# Patient Record
Sex: Male | Born: 1965 | Race: Black or African American | Hispanic: No | Marital: Married | Smoking: Never smoker
Health system: Southern US, Community
[De-identification: ages and names within clinical notes are randomized; demographics above are authoritative.]

## PROBLEM LIST (undated history)

## (undated) DIAGNOSIS — I10 Essential (primary) hypertension: Secondary | ICD-10-CM

## (undated) DIAGNOSIS — R519 Headache, unspecified: Secondary | ICD-10-CM

## (undated) DIAGNOSIS — R51 Headache: Secondary | ICD-10-CM

## (undated) DIAGNOSIS — M549 Dorsalgia, unspecified: Secondary | ICD-10-CM

## (undated) HISTORY — PX: ROTATOR CUFF REPAIR: SHX139

## (undated) HISTORY — DX: Essential (primary) hypertension: I10

## (undated) HISTORY — DX: Headache, unspecified: R51.9

## (undated) HISTORY — DX: Dorsalgia, unspecified: M54.9

## (undated) HISTORY — DX: Headache: R51

---

## 2011-10-26 HISTORY — PX: COLONOSCOPY: SHX174

## 2011-10-27 LAB — HM COLONOSCOPY

## 2014-06-18 LAB — PSA: PSA: 0.9

## 2014-06-18 LAB — CBC AND DIFFERENTIAL: HEMOGLOBIN: 15 g/dL (ref 13.5–17.5)

## 2014-06-18 LAB — TSH: TSH: 1.2 u[IU]/mL (ref <=5.90)

## 2014-06-18 LAB — LIPID PANEL
Cholesterol: 211 mg/dL — AB (ref 0–200)
HDL: 75 mg/dL — AB (ref 35–70)
LDL Cholesterol: 117 mg/dL
TRIGLYCERIDES: 95 mg/dL (ref 40–160)

## 2015-08-19 ENCOUNTER — Other Ambulatory Visit: Payer: Self-pay

## 2015-08-24 ENCOUNTER — Encounter: Payer: Self-pay | Admitting: Internal Medicine

## 2015-08-24 ENCOUNTER — Other Ambulatory Visit: Payer: Self-pay | Admitting: Internal Medicine

## 2015-08-24 DIAGNOSIS — G2581 Restless legs syndrome: Secondary | ICD-10-CM | POA: Insufficient documentation

## 2015-08-24 DIAGNOSIS — I10 Essential (primary) hypertension: Secondary | ICD-10-CM | POA: Insufficient documentation

## 2015-08-24 DIAGNOSIS — K580 Irritable bowel syndrome with diarrhea: Secondary | ICD-10-CM | POA: Insufficient documentation

## 2015-08-24 DIAGNOSIS — G43009 Migraine without aura, not intractable, without status migrainosus: Secondary | ICD-10-CM | POA: Insufficient documentation

## 2015-08-29 ENCOUNTER — Ambulatory Visit (INDEPENDENT_AMBULATORY_CARE_PROVIDER_SITE_OTHER): Payer: BLUE CROSS/BLUE SHIELD | Admitting: Internal Medicine

## 2015-08-29 ENCOUNTER — Encounter: Payer: Self-pay | Admitting: Internal Medicine

## 2015-08-29 VITALS — BP 140/84 | HR 84 | Ht 62.0 in | Wt 209.8 lb

## 2015-08-29 DIAGNOSIS — K580 Irritable bowel syndrome with diarrhea: Secondary | ICD-10-CM | POA: Diagnosis not present

## 2015-08-29 DIAGNOSIS — G43009 Migraine without aura, not intractable, without status migrainosus: Secondary | ICD-10-CM | POA: Diagnosis not present

## 2015-08-29 DIAGNOSIS — I10 Essential (primary) hypertension: Secondary | ICD-10-CM

## 2015-08-29 MED ORDER — AMLODIPINE BESYLATE 10 MG PO TABS: 10.0000 mg | ORAL_TABLET | Freq: Every day | ORAL | Status: DC

## 2015-08-29 MED ORDER — BENAZEPRIL HCL 20 MG PO TABS: 20.0000 mg | ORAL_TABLET | Freq: Every day | ORAL | Status: DC

## 2015-08-29 NOTE — Progress Notes (Signed)
Date:  08/29/2015   Name:  Earl Garcia   DOB:  1966-01-18   MRN:  [id number]   Chief Complaint: Hypertension Hypertension This is a chronic problem. The current episode started more than 1 year ago. The problem is unchanged. The problem is controlled. There are no associated agents to hypertension. Risk factors for coronary artery disease include diabetes mellitus and dyslipidemia. Past treatments include calcium channel blockers. The current treatment provides significant improvement. There are no compliance problems.   Migraine  This is a chronic problem. The current episode started more than 1 year ago. The problem occurs intermittently. Progression since onset: no headaches in the past 6 months. The pain quality is similar to prior headaches. The pain is severe. His past medical history is significant for hypertension.    Review of Systems  Patient Active Problem List   Diagnosis Date Noted  . Essential (primary) hypertension 08/24/2015  . Irritable bowel syndrome with diarrhea 08/24/2015  . Migraine without aura and responsive to treatment 08/24/2015  . Restless leg 08/24/2015    Prior to Admission medications   Medication Sig Start Date End Date Taking? Authorizing Provider  amLODipine (NORVASC) 10 MG tablet Take 1 tablet by mouth daily. 06/18/14  Yes Historical Provider, MD  benazepril (LOTENSIN) 20 MG tablet Take 1 tablet by mouth daily. 06/18/14  Yes Historical Provider, MD  SUMAtriptan (IMITREX) 100 MG tablet Take 1 tablet by mouth daily as needed. 02/04/15  Yes Historical Provider, MD  hyoscyamine (LEVSIN SL) 0.125 MG SL tablet Place 1 tablet under the tongue every 4 (four) hours as needed. 12/27/12   Historical Provider, MD    No Known Allergies  Past Surgical History  Procedure Laterality Date  . Colonoscopy  2013    tubular adenoma  . Rotator cuff repair      Social History  Substance Use Topics  . Smoking status: Never Smoker   . Smokeless tobacco: None  . Alcohol  Use: 2.4 oz/week    4 Standard drinks or equivalent per week     Medication list has been reviewed and updated.   Physical Exam  Constitutional: He is oriented to person, place, and time. He appears well-developed and well-nourished. No distress.  HENT:  Head: Normocephalic and atraumatic.  Eyes: Conjunctivae are normal. Right eye exhibits no discharge. Left eye exhibits no discharge. No scleral icterus.  Neck: Normal range of motion. Neck supple. No thyromegaly present.  Cardiovascular: Normal rate, regular rhythm and normal heart sounds.   No murmur heard. Pulmonary/Chest: Effort normal and breath sounds normal. No respiratory distress. He has no wheezes.  Musculoskeletal: Normal range of motion. He exhibits no edema.  Neurological: He is alert and oriented to person, place, and time. He has normal reflexes.  Skin: Skin is warm and dry. No rash noted.  Psychiatric: He has a normal mood and affect. His behavior is normal. Thought content normal.  Nursing note and vitals reviewed.   BP 140/84 mmHg  Pulse 84  Ht  (1.575 m)  Wt 209 lb 12.8 oz (95.165 kg)  BMI 38.36 kg/m2  Assessment and Plan: 1. Essential (primary) hypertension Controlled on current regimen - CBC with Differential/Platelet - Comprehensive metabolic panel - TSH - amLODipine (NORVASC) 10 MG tablet; Take 1 tablet (10 mg total) by mouth daily.  Dispense: 90 tablet; Refill: 3 - benazepril (LOTENSIN) 20 MG tablet; Take 1 tablet (20 mg total) by mouth daily.  Dispense: 90 tablet; Refill: 3  2. Migraine without aura and responsive  to treatment Well with only intermittent migraines and responsive to Imitrex  3. Irritable bowel syndrome with diarrhea Minimally symptomatic - requiring treatment at this time Continue high fiber diet   Bari EdwardLaura Charlene Cowdrey, MD [facility]  08/29/2015

## 2015-08-30 LAB — COMPREHENSIVE METABOLIC PANEL
ALK PHOS: 98 IU/L (ref 39–117)
ALT: 28 IU/L (ref 0–44)
AST: 23 IU/L (ref 0–40)
Albumin/Globulin Ratio: 1.5 (ref 1.1–2.5)
Albumin: 4.6 g/dL (ref 3.5–5.5)
BILIRUBIN TOTAL: 0.3 mg/dL (ref 0.0–1.2)
BUN / CREAT RATIO: 14 (ref 9–20)
BUN: 13 mg/dL (ref 6–24)
CO2: 25 mmol/L (ref 18–29)
CREATININE: 0.92 mg/dL (ref 0.76–1.27)
Calcium: 10.1 mg/dL (ref 8.7–10.2)
Chloride: 99 mmol/L (ref 97–106)
GFR calc Af Amer: 112 mL/min/{1.73_m2} (ref >59)
GFR calc non Af Amer: 97 mL/min/{1.73_m2} (ref >59)
GLUCOSE: 121 mg/dL — AB (ref 65–99)
Globulin, Total: 3 g/dL (ref 1.5–4.5)
Potassium: 3.8 mmol/L (ref 3.5–5.2)
Sodium: 142 mmol/L (ref 136–144)
Total Protein: 7.6 g/dL (ref 6.0–8.5)

## 2015-08-30 LAB — CBC WITH DIFFERENTIAL/PLATELET
BASOS ABS: 0 10*3/uL (ref 0.0–0.2)
BASOS: 0 %
EOS (ABSOLUTE): 0.1 10*3/uL (ref 0.0–0.4)
EOS: 2 %
HEMOGLOBIN: 15.3 g/dL (ref 12.6–17.7)
Hematocrit: 43.8 % (ref 37.5–51.0)
Immature Grans (Abs): 0 10*3/uL (ref 0.0–0.1)
Immature Granulocytes: 0 %
LYMPHS: 37 %
Lymphocytes Absolute: 1.7 10*3/uL (ref 0.7–3.1)
MCH: 29.2 pg (ref 26.6–33.0)
MCHC: 34.9 g/dL (ref 31.5–35.7)
MCV: 84 fL (ref 79–97)
Monocytes Absolute: 0.5 10*3/uL (ref 0.1–0.9)
Monocytes: 11 %
NEUTROS ABS: 2.3 10*3/uL (ref 1.4–7.0)
NEUTROS PCT: 50 %
PLATELETS: 287 10*3/uL (ref 150–379)
RBC: 5.24 x10E6/uL (ref 4.14–5.80)
RDW: 14.3 % (ref 12.3–15.4)
WBC: 4.6 10*3/uL (ref 3.4–10.8)

## 2015-08-30 LAB — TSH: TSH: 0.711 u[IU]/mL (ref 0.450–4.500)

## 2015-09-02 LAB — HGB A1C W/O EAG: Hgb A1c MFr Bld: 5.6 % (ref 4.8–5.6)

## 2015-09-02 LAB — SPECIMEN STATUS REPORT

## 2016-07-23 ENCOUNTER — Other Ambulatory Visit: Payer: Self-pay | Admitting: Internal Medicine

## 2016-07-26 ENCOUNTER — Other Ambulatory Visit: Payer: Self-pay | Admitting: Internal Medicine

## 2016-07-27 ENCOUNTER — Ambulatory Visit (INDEPENDENT_AMBULATORY_CARE_PROVIDER_SITE_OTHER): Payer: BLUE CROSS/BLUE SHIELD | Admitting: Internal Medicine

## 2016-07-27 ENCOUNTER — Encounter: Payer: Self-pay | Admitting: Internal Medicine

## 2016-07-27 VITALS — BP 118/80 | HR 80 | Resp 16 | Ht 62.0 in | Wt 209.0 lb

## 2016-07-27 DIAGNOSIS — I10 Essential (primary) hypertension: Secondary | ICD-10-CM

## 2016-07-27 DIAGNOSIS — G43009 Migraine without aura, not intractable, without status migrainosus: Secondary | ICD-10-CM | POA: Diagnosis not present

## 2016-07-27 MED ORDER — TRAMADOL HCL 50 MG PO TABS: 50.0000 mg | ORAL_TABLET | Freq: Every day | ORAL | 0 refills | Status: DC

## 2016-07-27 MED ORDER — SUMATRIPTAN SUCCINATE 100 MG PO TABS: 100.0000 mg | ORAL_TABLET | Freq: Two times a day (BID) | ORAL | 5 refills | Status: DC | PRN

## 2016-07-27 MED ORDER — BENAZEPRIL HCL 20 MG PO TABS: 20.0000 mg | ORAL_TABLET | Freq: Every day | ORAL | 3 refills | Status: DC

## 2016-07-27 MED ORDER — AMLODIPINE BESYLATE 10 MG PO TABS: 10.0000 mg | ORAL_TABLET | Freq: Every day | ORAL | 3 refills | Status: DC

## 2016-07-27 MED ORDER — BUTALBITAL-APAP-CAFFEINE 50-325-40 MG PO TABS
1.0000 | ORAL_TABLET | Freq: Four times a day (QID) | ORAL | 2 refills | Status: AC | PRN
Start: 2016-07-27 — End: 2017-07-27

## 2016-07-27 NOTE — Progress Notes (Signed)
Date:  07/27/2016   Name:  Earl Garcia   DOB:  Dec 12, 1965   MRN:  [id number]   Chief Complaint: Migraine (1 week ) Migraine   This is a recurrent problem. The current episode started more than 1 year ago. The problem occurs intermittently. The problem has been unchanged. Associated symptoms include nausea. Pertinent negatives include no coughing, dizziness, fever, vomiting or weakness. His past medical history is significant for hypertension.  Hypertension  This is a chronic problem. The current episode started more than 1 year ago. The problem is unchanged. The problem is controlled. Associated symptoms include headaches. Pertinent negatives include no chest pain, palpitations or shortness of breath.   He rarely has a migraine headache but has one now that has lasted 6 days.  He has taken butalbital alternating with imitrex with only temporary improvement in headache.    He reports having glucose and cholesterol done at work at [employer] last week.  He also got a flu shot.  He has not had CMP in several years.   Review of Systems  Constitutional: Negative for chills, fatigue and fever.  Eyes: Negative for visual disturbance.  Respiratory: Negative for cough, chest tightness and shortness of breath.   Cardiovascular: Negative for chest pain, palpitations and leg swelling.  Gastrointestinal: Positive for nausea. Negative for vomiting.  Neurological: Positive for light-headedness and headaches. Negative for dizziness, tremors and weakness.  Psychiatric/Behavioral: Positive for sleep disturbance (intermittent). Negative for dysphoric mood.    Patient Active Problem List   Diagnosis Date Noted  . Essential (primary) hypertension 08/24/2015  . Irritable bowel syndrome with diarrhea 08/24/2015  . Migraine without aura and responsive to treatment 08/24/2015  . Restless leg 08/24/2015    Prior to Admission medications   Medication Sig Start Date End Date Taking? Authorizing Provider    amLODipine (NORVASC) 10 MG tablet Take 1 tablet (10 mg total) by mouth daily. 08/29/15  Yes Reubin Milan, MD  benazepril (LOTENSIN) 20 MG tablet Take 1 tablet (20 mg total) by mouth daily. 08/29/15  Yes Reubin Milan, MD  SUMAtriptan (IMITREX) 100 MG tablet TAKE ONE TABLET BY MOUTH TWICE A DAY AS NEEDED 07/23/16  Yes Reubin Milan, MD    No Known Allergies  Past Surgical History:  Procedure Laterality Date  . COLONOSCOPY  2013   tubular adenoma  . ROTATOR CUFF REPAIR      Social History  Substance Use Topics  . Smoking status: Never Smoker  . Smokeless tobacco: Never Used  . Alcohol use 2.4 oz/week    4 Standard drinks or equivalent per week     Medication list has been reviewed and updated.   Physical Exam  Constitutional: He is oriented to person, place, and time. He appears well-developed. He appears distressed (due to headache).  HENT:  Head: Normocephalic and atraumatic.  Eyes: Pupils are equal, round, and reactive to light.  Neck: Normal range of motion. Neck supple. No thyromegaly present.  Cardiovascular: Normal rate, regular rhythm and normal heart sounds.   Pulmonary/Chest: Effort normal and breath sounds normal. No respiratory distress. He has no wheezes.  Musculoskeletal: Normal range of motion.  Neurological: He is alert and oriented to person, place, and time. He has normal strength and normal reflexes. No cranial nerve deficit or sensory deficit.  Skin: Skin is warm and dry. No rash noted.  Psychiatric: He has a normal mood and affect. His behavior is normal. Thought content normal.  Nursing note and vitals reviewed.  BP 118/80   Pulse 80   Resp 16   Ht 5\' 2"  (1.575 m)   Wt 209 lb (94.8 kg)   SpO2 99%   BMI 38.23 kg/m   Assessment and Plan: 1. Migraine without aura and responsive to treatment Continue to alternate medication Suspect this migraine will take longer to break Use tramadol at bedtime - butalbital-acetaminophen-caffeine  (FIORICET, ESGIC) 50-325-40 MG tablet; Take 1-2 tablets by mouth every 6 (six) hours as needed for headache.  Dispense: 60 tablet; Refill: 2 - SUMAtriptan (IMITREX) 100 MG tablet; Take 1 tablet (100 mg total) by mouth 2 (two) times daily as needed. May repeat in 2 hours if headache persists or recurs.  Dispense: 9 tablet; Refill: 5 - traMADol (ULTRAM) 50 MG tablet; Take 1 tablet (50 mg total) by mouth at bedtime.  Dispense: 20 tablet; Refill: 0  2. Essential (primary) hypertension Controlled Check CMP later this week - amLODipine (NORVASC) 10 MG tablet; Take 1 tablet (10 mg total) by mouth daily.  Dispense: 90 tablet; Refill: 3 - benazepril (LOTENSIN) 20 MG tablet; Take 1 tablet (20 mg total) by mouth daily.  Dispense: 90 tablet; Refill: 3 - Comprehensive metabolic panel   Bari EdwardLaura Berglund, MD [facility]  07/27/2016

## 2016-07-27 NOTE — Patient Instructions (Signed)
Take Advil 400 mg with Butalbital  Continue to alternate with imitrex  Take Tramadol at bedtime to help with sleep and pain

## 2017-01-19 ENCOUNTER — Encounter: Payer: Self-pay | Admitting: Internal Medicine

## 2017-01-19 ENCOUNTER — Ambulatory Visit (INDEPENDENT_AMBULATORY_CARE_PROVIDER_SITE_OTHER): Payer: BLUE CROSS/BLUE SHIELD | Admitting: Internal Medicine

## 2017-01-19 VITALS — BP 122/68 | HR 64 | Ht 62.0 in | Wt 193.8 lb

## 2017-01-19 DIAGNOSIS — G43009 Migraine without aura, not intractable, without status migrainosus: Secondary | ICD-10-CM

## 2017-01-19 DIAGNOSIS — G2581 Restless legs syndrome: Secondary | ICD-10-CM

## 2017-01-19 DIAGNOSIS — Z860101 Personal history of adenomatous and serrated colon polyps: Secondary | ICD-10-CM | POA: Insufficient documentation

## 2017-01-19 DIAGNOSIS — I10 Essential (primary) hypertension: Secondary | ICD-10-CM | POA: Diagnosis not present

## 2017-01-19 DIAGNOSIS — Z86010 Personal history of colonic polyps: Secondary | ICD-10-CM | POA: Diagnosis not present

## 2017-01-19 DIAGNOSIS — Z0000 Encounter for general adult medical examination without abnormal findings: Secondary | ICD-10-CM

## 2017-01-19 LAB — POCT URINALYSIS DIPSTICK
BILIRUBIN UA: NEGATIVE
GLUCOSE UA: NEGATIVE
KETONES UA: NEGATIVE
LEUKOCYTES UA: NEGATIVE
Nitrite, UA: NEGATIVE
PH UA: 6.5 (ref 5.0–8.0)
Protein, UA: NEGATIVE
RBC UA: NEGATIVE
Spec Grav, UA: 1.015 (ref 1.030–1.035)
Urobilinogen, UA: 0.2 (ref ?–2.0)

## 2017-01-19 MED ORDER — TRAMADOL HCL 50 MG PO TABS: 50.0000 mg | ORAL_TABLET | Freq: Every day | ORAL | 0 refills | Status: DC

## 2017-01-19 NOTE — Patient Instructions (Signed)
DASH Eating Plan DASH stands for "Dietary Approaches to Stop Hypertension." The DASH eating plan is a healthy eating plan that has been shown to reduce high blood pressure (hypertension). It may also reduce your risk for type 2 diabetes, heart disease, and stroke. The DASH eating plan may also help with weight loss. What are tips for following this plan? General guidelines  Avoid eating more than 2,300 mg (milligrams) of salt (sodium) a day. If you have hypertension, you may need to reduce your sodium intake to 1,500 mg a day.  Limit alcohol intake to no more than 1 drink a day for nonpregnant women and 2 drinks a day for men. One drink equals 12 oz of beer, 5 oz of wine, or 1 oz of hard liquor.  Work with your health care provider to maintain a healthy body weight or to lose weight. Ask what an ideal weight is for you.  Get at least 30 minutes of exercise that causes your heart to beat faster (aerobic exercise) most days of the week. Activities may include walking, swimming, or biking.  Work with your health care provider or diet and nutrition specialist (dietitian) to adjust your eating plan to your individual calorie needs. Reading food labels  Check food labels for the amount of sodium per serving. Choose foods with less than 5 percent of the Daily Value of sodium. Generally, foods with less than 300 mg of sodium per serving fit into this eating plan.  To find whole grains, look for the word "whole" as the first word in the ingredient list. Shopping  Buy products labeled as "low-sodium" or "no salt added."  Buy fresh foods. Avoid canned foods and premade or frozen meals. Cooking  Avoid adding salt when cooking. Use salt-free seasonings or herbs instead of table salt or sea salt. Check with your health care provider or pharmacist before using salt substitutes.  Do not fry foods. Cook foods using healthy methods such as baking, boiling, grilling, and broiling instead.  Cook with  heart-healthy oils, such as olive, canola, soybean, or sunflower oil. Meal planning   Eat a balanced diet that includes: ? 5 or more servings of fruits and vegetables each day. At each meal, try to fill half of your plate with fruits and vegetables. ? Up to 6-8 servings of whole grains each day. ? Less than 6 oz of lean meat, poultry, or fish each day. A 3-oz serving of meat is about the same size as a deck of cards. One egg equals 1 oz. ? 2 servings of low-fat dairy each day. ? A serving of nuts, seeds, or beans 5 times each week. ? Heart-healthy fats. Healthy fats called Omega-3 fatty acids are found in foods such as flaxseeds and coldwater fish, like sardines, salmon, and mackerel.  Limit how much you eat of the following: ? Canned or prepackaged foods. ? Food that is high in trans fat, such as fried foods. ? Food that is high in saturated fat, such as fatty meat. ? Sweets, desserts, sugary drinks, and other foods with added sugar. ? Full-fat dairy products.  Do not salt foods before eating.  Try to eat at least 2 vegetarian meals each week.  Eat more home-cooked food and less restaurant, buffet, and fast food.  When eating at a restaurant, ask that your food be prepared with less salt or no salt, if possible. What foods are recommended? The items listed may not be a complete list. Talk with your dietitian about what   dietary choices are best for you. Grains Whole-grain or whole-wheat bread. Whole-grain or whole-wheat pasta. Brown rice. Oatmeal. Quinoa. Bulgur. Whole-grain and low-sodium cereals. Pita bread. Low-fat, low-sodium crackers. Whole-wheat flour tortillas. Vegetables Fresh or frozen vegetables (raw, steamed, roasted, or grilled). Low-sodium or reduced-sodium tomato and vegetable juice. Low-sodium or reduced-sodium tomato sauce and tomato paste. Low-sodium or reduced-sodium canned vegetables. Fruits All fresh, dried, or frozen fruit. Canned fruit in natural juice (without  added sugar). Meat and other protein foods Skinless chicken or turkey. Ground chicken or turkey. Pork with fat trimmed off. Fish and seafood. Egg whites. Dried beans, peas, or lentils. Unsalted nuts, nut butters, and seeds. Unsalted canned beans. Lean cuts of beef with fat trimmed off. Low-sodium, lean deli meat. Dairy Low-fat (1%) or fat-free (skim) milk. Fat-free, low-fat, or reduced-fat cheeses. Nonfat, low-sodium ricotta or cottage cheese. Low-fat or nonfat yogurt. Low-fat, low-sodium cheese. Fats and oils Soft margarine without trans fats. Vegetable oil. Low-fat, reduced-fat, or light mayonnaise and salad dressings (reduced-sodium). Canola, safflower, olive, soybean, and sunflower oils. Avocado. Seasoning and other foods Herbs. Spices. Seasoning mixes without salt. Unsalted popcorn and pretzels. Fat-free sweets. What foods are not recommended? The items listed may not be a complete list. Talk with your dietitian about what dietary choices are best for you. Grains Baked goods made with fat, such as croissants, muffins, or some breads. Dry pasta or rice meal packs. Vegetables Creamed or fried vegetables. Vegetables in a cheese sauce. Regular canned vegetables (not low-sodium or reduced-sodium). Regular canned tomato sauce and paste (not low-sodium or reduced-sodium). Regular tomato and vegetable juice (not low-sodium or reduced-sodium). Pickles. Olives. Fruits Canned fruit in a light or heavy syrup. Fried fruit. Fruit in cream or butter sauce. Meat and other protein foods Fatty cuts of meat. Ribs. Fried meat. Bacon. Sausage. Bologna and other processed lunch meats. Salami. Fatback. Hotdogs. Bratwurst. Salted nuts and seeds. Canned beans with added salt. Canned or smoked fish. Whole eggs or egg yolks. Chicken or turkey with skin. Dairy Whole or 2% milk, cream, and half-and-half. Whole or full-fat cream cheese. Whole-fat or sweetened yogurt. Full-fat cheese. Nondairy creamers. Whipped toppings.  Processed cheese and cheese spreads. Fats and oils Butter. Stick margarine. Lard. Shortening. Ghee. Bacon fat. Tropical oils, such as coconut, palm kernel, or palm oil. Seasoning and other foods Salted popcorn and pretzels. Onion salt, garlic salt, seasoned salt, table salt, and sea salt. Worcestershire sauce. Tartar sauce. Barbecue sauce. Teriyaki sauce. Soy sauce, including reduced-sodium. Steak sauce. Canned and packaged gravies. Fish sauce. Oyster sauce. Cocktail sauce. Horseradish that you find on the shelf. Ketchup. Mustard. Meat flavorings and tenderizers. Bouillon cubes. Hot sauce and Tabasco sauce. Premade or packaged marinades. Premade or packaged taco seasonings. Relishes. Regular salad dressings. Where to find more information:  National Heart, Lung, and Blood Institute: www.nhlbi.nih.gov  American Heart Association: www.heart.org Summary  The DASH eating plan is a healthy eating plan that has been shown to reduce high blood pressure (hypertension). It may also reduce your risk for type 2 diabetes, heart disease, and stroke.  With the DASH eating plan, you should limit salt (sodium) intake to 2,300 mg a day. If you have hypertension, you may need to reduce your sodium intake to 1,500 mg a day.  When on the DASH eating plan, aim to eat more fresh fruits and vegetables, whole grains, lean proteins, low-fat dairy, and heart-healthy fats.  Work with your health care provider or diet and nutrition specialist (dietitian) to adjust your eating plan to your individual   calorie needs. This information is not intended to replace advice given to you by your health care provider. Make sure you discuss any questions you have with your health care provider. Document Released: 09/30/2011 Document Revised: 10/04/2016 Document Reviewed: 10/04/2016 Elsevier Interactive Patient Education  2017 Elsevier Inc.  

## 2017-01-19 NOTE — Progress Notes (Signed)
Date:  01/19/2017   Name:  Earl Garcia   DOB:  03/13/1966   MRN:  [id number]   Chief Complaint: Annual Exam Earl Garcia is a 51 y.o. male who presents today for his Complete Annual Exam. He feels well. He reports exercising more and has lost some weight. He reports he is sleeping fairly well.   Hypertension  This is a chronic problem. The problem is unchanged. The problem is controlled. Associated symptoms include headaches. Pertinent negatives include no chest pain, palpitations or shortness of breath. Past treatments include ACE inhibitors and calcium channel blockers. The current treatment provides significant improvement.  Migraine   This is a chronic problem. The problem occurs seasonly. The pain quality is similar to prior headaches. Pertinent negatives include no abdominal pain, back pain, dizziness, hearing loss, numbness or tinnitus. His past medical history is significant for hypertension.  RLS - symptoms are improved recently.  He has tramadol to use as needed.    Review of Systems  Constitutional: Negative for appetite change, chills, diaphoresis, fatigue and unexpected weight change.  HENT: Negative for hearing loss, tinnitus, trouble swallowing and voice change.   Eyes: Negative for visual disturbance.  Respiratory: Negative for choking, shortness of breath and wheezing.   Cardiovascular: Negative for chest pain, palpitations and leg swelling.  Gastrointestinal: Negative for abdominal pain, blood in stool, constipation and diarrhea.  Genitourinary: Negative for difficulty urinating, dysuria and frequency.  Musculoskeletal: Negative for arthralgias, back pain and myalgias.  Skin: Negative for color change and rash.  Allergic/Immunologic: Negative for environmental allergies.  Neurological: Positive for headaches. Negative for dizziness, syncope and numbness.  Hematological: Negative for adenopathy.  Psychiatric/Behavioral: Negative for dysphoric mood and sleep  disturbance. The patient is not nervous/anxious.     Patient Active Problem List   Diagnosis Date Noted  . Hx of adenomatous colonic polyps 01/19/2017  . Essential (primary) hypertension 08/24/2015  . Irritable bowel syndrome with diarrhea 08/24/2015  . Migraine without aura and responsive to treatment 08/24/2015  . Restless leg 08/24/2015    Prior to Admission medications   Medication Sig Start Date End Date Taking? Authorizing Provider  amLODipine (NORVASC) 10 MG tablet Take 1 tablet (10 mg total) by mouth daily. 07/27/16  Yes Reubin MilanLaura H Jaliza Seifried, MD  benazepril (LOTENSIN) 20 MG tablet Take 1 tablet (20 mg total) by mouth daily. 07/27/16  Yes Reubin MilanLaura H Shantaya Bluestone, MD  butalbital-acetaminophen-caffeine (FIORICET, ESGIC) [id number] MG tablet Take 1-2 tablets by mouth every 6 (six) hours as needed for headache. 07/27/16 07/27/17 Yes Reubin MilanLaura H Hoke Baer, MD  SUMAtriptan (IMITREX) 100 MG tablet Take 1 tablet (100 mg total) by mouth 2 (two) times daily as needed. May repeat in 2 hours if headache persists or recurs. 07/27/16  Yes Reubin MilanLaura H Shravan Salahuddin, MD  traMADol (ULTRAM) 50 MG tablet Take 1 tablet (50 mg total) by mouth at bedtime. 07/27/16  Yes Reubin MilanLaura H Lagena Strand, MD    No Known Allergies  Past Surgical History:  Procedure Laterality Date  . COLONOSCOPY  2013   tubular adenoma  . ROTATOR CUFF REPAIR      Social History  Substance Use Topics  . Smoking status: Never Smoker  . Smokeless tobacco: Never Used  . Alcohol use 2.4 oz/week    4 Standard drinks or equivalent per week     Medication list has been reviewed and updated.   Physical Exam  Constitutional: He is oriented to person, place, and time. He appears well-developed and well-nourished.  HENT:  Head:  Normocephalic.  Right Ear: Tympanic membrane, external ear and ear canal normal.  Left Ear: Tympanic membrane, external ear and ear canal normal.  Nose: Nose normal.  Mouth/Throat: Uvula is midline and oropharynx is clear and moist.    Eyes: Conjunctivae and EOM are normal. Pupils are equal, round, and reactive to light.  Neck: Normal range of motion. Neck supple. Carotid bruit is not present. No thyromegaly present.  Cardiovascular: Normal rate, regular rhythm, normal heart sounds and intact distal pulses.   Pulmonary/Chest: Effort normal and breath sounds normal. He has no wheezes. Right breast exhibits no mass. Left breast exhibits no mass.  Abdominal: Soft. Normal appearance and bowel sounds are normal. There is no hepatosplenomegaly. There is no tenderness.  Musculoskeletal: Normal range of motion.  Lymphadenopathy:    He has no cervical adenopathy.  Neurological: He is alert and oriented to person, place, and time. He has normal reflexes.  Skin: Skin is warm, dry and intact.  Psychiatric: He has a normal mood and affect. His speech is normal and behavior is normal. Judgment and thought content normal.  Nursing note and vitals reviewed.   BP 122/68 (BP Location: Right Arm, Patient Position: Sitting, Cuff Size: Normal)   Pulse 64   Ht 5\' 2"  (1.575 m)   Wt 193 lb 12.8 oz (87.9 kg)   BMI 35.45 kg/m   Assessment and Plan: 1. Annual physical exam Continue diet and weight loss Discussed PSA screening - patient opted no testing - POCT urinalysis dipstick  2. Migraine without aura and responsive to treatment Stable - continue Fioricet PRN  3. Essential (primary) hypertension controlled - CBC with Differential/Platelet - Comprehensive metabolic panel - Lipid panel  4. Restless leg Continue tramadol PRN  5. Hx of adenomatous colonic polyps Refer for 5 year follow up - Ambulatory referral to Gastroenterology   Meds ordered this encounter  Medications  . traMADol (ULTRAM) 50 MG tablet    Sig: Take 1 tablet (50 mg total) by mouth at bedtime.    Dispense:  30 tablet    Refill:  0    Bari Edward, MD [facility]  01/19/2017

## 2017-01-20 LAB — COMPREHENSIVE METABOLIC PANEL
ALBUMIN: 4.4 g/dL (ref 3.5–5.5)
ALK PHOS: 86 IU/L (ref 39–117)
ALT: 25 IU/L (ref 0–44)
AST: 21 IU/L (ref 0–40)
Albumin/Globulin Ratio: 1.5 (ref 1.2–2.2)
BUN / CREAT RATIO: 18 (ref 9–20)
BUN: 14 mg/dL (ref 6–24)
Bilirubin Total: 0.4 mg/dL (ref 0.0–1.2)
CALCIUM: 9.9 mg/dL (ref 8.7–10.2)
CO2: 30 mmol/L — AB (ref 18–29)
CREATININE: 0.79 mg/dL (ref 0.76–1.27)
Chloride: 99 mmol/L (ref 96–106)
GFR calc Af Amer: 120 mL/min/{1.73_m2} (ref >59)
GFR, EST NON AFRICAN AMERICAN: 104 mL/min/{1.73_m2} (ref >59)
GLOBULIN, TOTAL: 3 g/dL (ref 1.5–4.5)
Glucose: 97 mg/dL (ref 65–99)
Potassium: 4.3 mmol/L (ref 3.5–5.2)
SODIUM: 143 mmol/L (ref 134–144)
Total Protein: 7.4 g/dL (ref 6.0–8.5)

## 2017-01-20 LAB — CBC WITH DIFFERENTIAL/PLATELET
BASOS: 1 %
Basophils Absolute: 0 10*3/uL (ref 0.0–0.2)
EOS (ABSOLUTE): 0.2 10*3/uL (ref 0.0–0.4)
EOS: 4 %
HEMATOCRIT: 44.6 % (ref 37.5–51.0)
HEMOGLOBIN: 15.1 g/dL (ref 13.0–17.7)
Immature Grans (Abs): 0 10*3/uL (ref 0.0–0.1)
Immature Granulocytes: 0 %
LYMPHS ABS: 2.1 10*3/uL (ref 0.7–3.1)
Lymphs: 49 %
MCH: 29.6 pg (ref 26.6–33.0)
MCHC: 33.9 g/dL (ref 31.5–35.7)
MCV: 88 fL (ref 79–97)
MONOCYTES: 10 %
Monocytes Absolute: 0.4 10*3/uL (ref 0.1–0.9)
Neutrophils Absolute: 1.5 10*3/uL (ref 1.4–7.0)
Neutrophils: 36 %
Platelets: 275 10*3/uL (ref 150–379)
RBC: 5.1 x10E6/uL (ref 4.14–5.80)
RDW: 14.2 % (ref 12.3–15.4)
WBC: 4.2 10*3/uL (ref 3.4–10.8)

## 2017-01-20 LAB — LIPID PANEL
CHOL/HDL RATIO: 2.4 ratio (ref 0.0–5.0)
Cholesterol, Total: 204 mg/dL — ABNORMAL HIGH (ref 100–199)
HDL: 85 mg/dL (ref >39)
LDL CALC: 102 mg/dL — AB (ref 0–99)
Triglycerides: 86 mg/dL (ref 0–149)
VLDL Cholesterol Cal: 17 mg/dL (ref 5–40)

## 2017-03-09 HISTORY — PX: COLONOSCOPY: SHX174

## 2017-03-09 LAB — HM COLONOSCOPY

## 2017-08-01 ENCOUNTER — Telehealth: Payer: Self-pay

## 2017-08-01 ENCOUNTER — Other Ambulatory Visit: Payer: Self-pay | Admitting: Internal Medicine

## 2017-08-01 DIAGNOSIS — I10 Essential (primary) hypertension: Secondary | ICD-10-CM

## 2017-08-01 MED ORDER — AMLODIPINE BESYLATE 10 MG PO TABS: 10.0000 mg | ORAL_TABLET | Freq: Every day | ORAL | 1 refills | Status: DC

## 2017-08-01 MED ORDER — BENAZEPRIL HCL 20 MG PO TABS: 20.0000 mg | ORAL_TABLET | Freq: Every day | ORAL | 1 refills | Status: DC

## 2017-08-01 NOTE — Telephone Encounter (Signed)
Patient requesting refills through FAX from [facility] in [place], [facility].  Needs refill on Benazepril, and amlodipine refilled. Please Advise.

## 2017-12-12 ENCOUNTER — Ambulatory Visit (INDEPENDENT_AMBULATORY_CARE_PROVIDER_SITE_OTHER): Payer: Managed Care, Other (non HMO) | Admitting: Internal Medicine

## 2017-12-12 ENCOUNTER — Encounter: Payer: Self-pay | Admitting: Internal Medicine

## 2017-12-12 VITALS — BP 118/82 | HR 92 | Resp 16 | Ht 62.0 in | Wt 186.0 lb

## 2017-12-12 DIAGNOSIS — M5136 Other intervertebral disc degeneration, lumbar region: Secondary | ICD-10-CM

## 2017-12-12 DIAGNOSIS — M5416 Radiculopathy, lumbar region: Secondary | ICD-10-CM | POA: Diagnosis not present

## 2017-12-12 MED ORDER — PREDNISONE 10 MG PO TABS
ORAL_TABLET | ORAL | 0 refills | Status: DC
Start: 2017-12-12 — End: 2018-01-25

## 2017-12-12 MED ORDER — OXYCODONE HCL 10 MG PO TABS: 10.0000 mg | ORAL_TABLET | Freq: Four times a day (QID) | ORAL | 0 refills | Status: DC | PRN

## 2017-12-12 NOTE — Progress Notes (Signed)
Date:  12/12/2017   Name:  Earl Garcia   DOB:  07/12/1966   MRN:  [id number]   Chief Complaint: Groin Pain (Did have injury x 3 weeks ago lifting at work but pain was more in lower back was not seen at that time. Now started having pain in R side and down R leg and now at knee with some knee swelling. [facility] and diagnosed with muscle injury given IBP that has not touched pain as well as muscle relaxer that does help some with spasms. [facility] and  they gave Oxycodone for pain. Unsure what this is and having trouble moving at all. No BM in 2-3 days/ urine stream changes)  Pt states that he had a twisting stress injury to his lower back about 3 weeks ago - was attaching his trailer to his truck.  He used ice and advil and improved over the next week.  5 days ago he got up from bed and had severe back pain radiating to his right groin.  He used advil and ice but continued to worsen so he went to the UC.  They dx'd groin strain and gave him flexeril and advil.  Over the weekend he continued to have more pain despite medication so yesterday went to the ED.  Lumbar films showed mild degenerative changes.  He had no red flag signs so was given oxycodone for pain and told to follow up with PCP. Today he is in a wheelchair because of continued worsening of pain.  He is unable to stand without severe pain radiating to his right knee and a sensation of weakness.  He was able to rest fitfully taking oxycodone last night.  He reports the pain is constant, can be partly relieved by reclining but moderately severe just sitting and much worse when standing.  He has no loss of bowel or bladder control, no saddle anesthesia but new radicular pain in right leg.  Review of Systems  Constitutional: Negative for chills, fatigue and unexpected weight change.  Respiratory: Negative for chest tightness and shortness of breath.   Cardiovascular: Negative for chest pain.    Gastrointestinal: Positive for constipation. Negative for abdominal pain.  Genitourinary: Negative for difficulty urinating.  Musculoskeletal: Positive for arthralgias, back pain, gait problem and joint swelling.  Neurological: Positive for weakness. Negative for dizziness and headaches.  Psychiatric/Behavioral: Positive for sleep disturbance.    Patient Active Problem List   Diagnosis Date Noted  . Hx of adenomatous colonic polyps 01/19/2017  . Essential (primary) hypertension 08/24/2015  . Irritable bowel syndrome with diarrhea 08/24/2015  . Migraine without aura and responsive to treatment 08/24/2015  . Restless leg 08/24/2015    Prior to Admission medications   Medication Sig Start Date End Date Taking? Authorizing Provider  amLODipine (NORVASC) 10 MG tablet Take 1 tablet (10 mg total) by mouth daily. 08/01/17  Yes Reubin MilanBerglund, Kendry Pfarr H, MD  benazepril (LOTENSIN) 20 MG tablet Take 1 tablet (20 mg total) by mouth daily. 08/01/17  Yes Reubin MilanBerglund, Aura Bibby H, MD  oxyCODONE-acetaminophen (PERCOCET/ROXICET) 5-325 MG tablet Take by mouth every 4 (four) hours as needed for severe pain.   Yes [provider]    No Known Allergies  Past Surgical History:  Procedure Laterality Date  . COLONOSCOPY  2013   tubular adenoma  . ROTATOR CUFF REPAIR      Social History   Tobacco Use  . Smoking status: Never Smoker  .  Smokeless tobacco: Never Used  Substance Use Topics  . Alcohol use: Yes    Alcohol/week: 2.4 oz    Types: 4 Standard drinks or equivalent per week  . Drug use: No     Medication list has been reviewed and updated.  PHQ 2/9 Scores 12/12/2017  PHQ - 2 Score 0    Physical Exam  Constitutional: He is oriented to person, place, and time. Vital signs are normal. He appears well-developed. He appears distressed.  HENT:  Head: Normocephalic and atraumatic.  Neck: Normal range of motion.  Cardiovascular: Normal rate, regular rhythm and normal heart sounds.   Pulmonary/Chest: Effort normal and breath sounds normal. No respiratory distress. He has no decreased breath sounds. He has no wheezes.  Musculoskeletal: Normal range of motion.       Right knee: He exhibits normal range of motion and no swelling.       Left knee: He exhibits normal range of motion and no swelling.       Lumbar back: He exhibits tenderness. He exhibits no bony tenderness and no spasm.  Neurological: He is alert and oriented to person, place, and time. No sensory deficit.  Reflex Scores:      Patellar reflexes are 2+ on the right side and 1+ on the left side. SLR positive on right; negative on left Unable to assess gait due to pain   Skin: Skin is warm and dry. No rash noted.  Psychiatric: He has a normal mood and affect. His behavior is normal. Thought content normal.  Nursing note and vitals reviewed.   BP 118/82   Pulse 92   Resp 16   Ht 5\' 2"  (1.575 m)   Wt 186 lb (84.4 kg)   SpO2 97%   BMI 34.02 kg/m   Assessment and Plan: 1. Lumbar back pain with radiculopathy affecting right lower extremity Progressive symptoms despite conservative treatment is concerning for disc herniation Continue flexeril Add prednisone taper Short term oxycodone Rx given - predniSONE (DELTASONE) 10 MG tablet; Take 6 on day 1, 5 on day 2, 4 on day 3, 3 on day 4, 2 on day 5 and 1 on day 1 then stop.  Dispense: 21 tablet; Refill: 0 - Oxycodone HCl 10 MG TABS; Take 1 tablet (10 mg total) by mouth every 6 (six) hours as needed.  Dispense: 20 tablet; Refill: 0 - MR Lumbar Spine Wo Contrast; Future     Meds ordered this encounter  Medications  . predniSONE (DELTASONE) 10 MG tablet    Sig: Take 6 on day 1, 5 on day 2, 4 on day 3, 3 on day 4, 2 on day 5 and 1 on day 1 then stop.    Dispense:  21 tablet    Refill:  0  . Oxycodone HCl 10 MG TABS    Sig: Take 1 tablet (10 mg total) by mouth every 6 (six) hours as needed.    Dispense:  20 tablet    Refill:  0    Partially dictated  using Animal nutritionist. Any errors are unintentional.  Bari Edward, MD [facility]  12/12/2017

## 2017-12-14 ENCOUNTER — Telehealth: Payer: Self-pay

## 2017-12-14 ENCOUNTER — Other Ambulatory Visit: Payer: Self-pay

## 2017-12-14 DIAGNOSIS — M5416 Radiculopathy, lumbar region: Secondary | ICD-10-CM

## 2017-12-14 NOTE — Telephone Encounter (Signed)
Called patient and informed patient was denied for MRI of lumbar spine through insurance. We received the FAX this morning. Informed him the next step would be a referral to orthopedic surgery. He would like to go to [facility] [place]. Placed referral and will call to see when the next available appt will be to get him in there ASAP.

## 2017-12-14 NOTE — Telephone Encounter (Signed)
Called and informed patient of appt at 8am at [facility]. He verbalized understanding and will be there.

## 2017-12-14 NOTE — Telephone Encounter (Signed)
Patient called stating he just got off the phone with [facility] in [facility]. They told him his insurance approved the MRI for tomorrow.  He stated he is confused because this morning I called him informing him it was not covered. I told him based off the papers I received from insurance it was not covered and we were sending him to Orthopedics. He asked if I could contact [facility] myself to find out what's going on. I called them and spoke with a Angelique Blonderenise from the billing/financial department in there office they informed me they ran it through insurance at 2:54PM this afternoon and insurance gave them a approval PA code. I told her the patient is concerned about having to pay out of pocket for this since we got a denial this morning. She stated from there end it was approved and he should not be concerned.  I called the patient and informed him- and I told him since they are stating it was approved Dr Judithann GravesBerglund stated he should go have the MRI done. We will call when his results are back. He verbalized understanding of this and I informed him I will document this all in his chart for future reference.

## 2017-12-21 ENCOUNTER — Telehealth: Payer: Self-pay

## 2017-12-21 NOTE — Telephone Encounter (Signed)
Patient called stating he would like letter to return back to work this Monday March 4th. Seen Ortho Doctor in [place] which gave him options for PT, injections, and a surgical procedure which he declined. Feels much better today. Stopped pain medications 2 days ago. No swelling and back pain is almost gone. Just working to get his knee stronger. Needs letter faxed to wife's work FAX ATTN: Brewing technologistAvis Rakin-Ahlin. FAX# [phone]  Please Advise.

## 2017-12-22 ENCOUNTER — Encounter: Payer: Self-pay | Admitting: Internal Medicine

## 2017-12-22 NOTE — Telephone Encounter (Signed)
Letter written and ready to be faxed. 

## 2017-12-22 NOTE — Telephone Encounter (Signed)
Faxed letter to wife FAX number and ATTN to her. Received confirmation fax.

## 2017-12-26 ENCOUNTER — Encounter: Payer: Self-pay | Admitting: Internal Medicine

## 2018-01-25 ENCOUNTER — Encounter: Payer: Self-pay | Admitting: Internal Medicine

## 2018-01-25 ENCOUNTER — Ambulatory Visit (INDEPENDENT_AMBULATORY_CARE_PROVIDER_SITE_OTHER): Payer: Managed Care, Other (non HMO) | Admitting: Internal Medicine

## 2018-01-25 VITALS — BP 150/92 | HR 80 | Ht 62.0 in | Wt 197.0 lb

## 2018-01-25 DIAGNOSIS — Z23 Encounter for immunization: Secondary | ICD-10-CM

## 2018-01-25 DIAGNOSIS — I10 Essential (primary) hypertension: Secondary | ICD-10-CM

## 2018-01-25 DIAGNOSIS — M25552 Pain in left hip: Secondary | ICD-10-CM | POA: Diagnosis not present

## 2018-01-25 DIAGNOSIS — M5126 Other intervertebral disc displacement, lumbar region: Secondary | ICD-10-CM | POA: Insufficient documentation

## 2018-01-25 DIAGNOSIS — Z0001 Encounter for general adult medical examination with abnormal findings: Secondary | ICD-10-CM | POA: Diagnosis not present

## 2018-01-25 DIAGNOSIS — Z0000 Encounter for general adult medical examination without abnormal findings: Secondary | ICD-10-CM

## 2018-01-25 DIAGNOSIS — Z125 Encounter for screening for malignant neoplasm of prostate: Secondary | ICD-10-CM

## 2018-01-25 DIAGNOSIS — Z86010 Personal history of colonic polyps: Secondary | ICD-10-CM

## 2018-01-25 LAB — POCT URINALYSIS DIPSTICK
Bilirubin, UA: NEGATIVE
Glucose, UA: NEGATIVE
KETONES UA: NEGATIVE
LEUKOCYTES UA: NEGATIVE
NITRITE UA: NEGATIVE
RBC UA: NEGATIVE
SPEC GRAV UA: 1.015 (ref 1.010–1.025)
Urobilinogen, UA: 0.2 E.U./dL
pH, UA: 6.5 (ref 5.0–8.0)

## 2018-01-25 MED ORDER — ZOSTER VAC RECOMB ADJUVANTED 50 MCG/0.5ML IM SUSR: 0.5000 mL | Freq: Once | INTRAMUSCULAR | 1 refills | Status: AC

## 2018-01-25 MED ORDER — BENAZEPRIL HCL 40 MG PO TABS: 40.0000 mg | ORAL_TABLET | Freq: Every day | ORAL | 1 refills | Status: DC

## 2018-01-25 MED ORDER — AMLODIPINE BESYLATE 10 MG PO TABS: 10.0000 mg | ORAL_TABLET | Freq: Every day | ORAL | 1 refills | Status: DC

## 2018-01-25 NOTE — Patient Instructions (Signed)
DASH Eating Plan DASH stands for "Dietary Approaches to Stop Hypertension." The DASH eating plan is a healthy eating plan that has been shown to reduce high blood pressure (hypertension). It may also reduce your risk for type 2 diabetes, heart disease, and stroke. The DASH eating plan may also help with weight loss. What are tips for following this plan? General guidelines  Avoid eating more than 2,300 mg (milligrams) of salt (sodium) a day. If you have hypertension, you may need to reduce your sodium intake to 1,500 mg a day.  Limit alcohol intake to no more than 1 drink a day for nonpregnant women and 2 drinks a day for men. One drink equals 12 oz of beer, 5 oz of wine, or 1 oz of hard liquor.  Work with your health care provider to maintain a healthy body weight or to lose weight. Ask what an ideal weight is for you.  Get at least 30 minutes of exercise that causes your heart to beat faster (aerobic exercise) most days of the week. Activities may include walking, swimming, or biking.  Work with your health care provider or diet and nutrition specialist (dietitian) to adjust your eating plan to your individual calorie needs. Reading food labels  Check food labels for the amount of sodium per serving. Choose foods with less than 5 percent of the Daily Value of sodium. Generally, foods with less than 300 mg of sodium per serving fit into this eating plan.  To find whole grains, look for the word "whole" as the first word in the ingredient list. Shopping  Buy products labeled as "low-sodium" or "no salt added."  Buy fresh foods. Avoid canned foods and premade or frozen meals. Cooking  Avoid adding salt when cooking. Use salt-free seasonings or herbs instead of table salt or sea salt. Check with your health care provider or pharmacist before using salt substitutes.  Do not fry foods. Cook foods using healthy methods such as baking, boiling, grilling, and broiling instead.  Cook with  heart-healthy oils, such as olive, canola, soybean, or sunflower oil. Meal planning   Eat a balanced diet that includes: ? 5 or more servings of fruits and vegetables each day. At each meal, try to fill half of your plate with fruits and vegetables. ? Up to 6-8 servings of whole grains each day. ? Less than 6 oz of lean meat, poultry, or fish each day. A 3-oz serving of meat is about the same size as a deck of cards. One egg equals 1 oz. ? 2 servings of low-fat dairy each day. ? A serving of nuts, seeds, or beans 5 times each week. ? Heart-healthy fats. Healthy fats called Omega-3 fatty acids are found in foods such as flaxseeds and coldwater fish, like sardines, salmon, and mackerel.  Limit how much you eat of the following: ? Canned or prepackaged foods. ? Food that is high in trans fat, such as fried foods. ? Food that is high in saturated fat, such as fatty meat. ? Sweets, desserts, sugary drinks, and other foods with added sugar. ? Full-fat dairy products.  Do not salt foods before eating.  Try to eat at least 2 vegetarian meals each week.  Eat more home-cooked food and less restaurant, buffet, and fast food.  When eating at a restaurant, ask that your food be prepared with less salt or no salt, if possible. What foods are recommended? The items listed may not be a complete list. Talk with your dietitian about what   dietary choices are best for you. Grains Whole-grain or whole-wheat bread. Whole-grain or whole-wheat pasta. Brown rice. Oatmeal. Quinoa. Bulgur. Whole-grain and low-sodium cereals. Pita bread. Low-fat, low-sodium crackers. Whole-wheat flour tortillas. Vegetables Fresh or frozen vegetables (raw, steamed, roasted, or grilled). Low-sodium or reduced-sodium tomato and vegetable juice. Low-sodium or reduced-sodium tomato sauce and tomato paste. Low-sodium or reduced-sodium canned vegetables. Fruits All fresh, dried, or frozen fruit. Canned fruit in natural juice (without  added sugar). Meat and other protein foods Skinless chicken or turkey. Ground chicken or turkey. Pork with fat trimmed off. Fish and seafood. Egg whites. Dried beans, peas, or lentils. Unsalted nuts, nut butters, and seeds. Unsalted canned beans. Lean cuts of beef with fat trimmed off. Low-sodium, lean deli meat. Dairy Low-fat (1%) or fat-free (skim) milk. Fat-free, low-fat, or reduced-fat cheeses. Nonfat, low-sodium ricotta or cottage cheese. Low-fat or nonfat yogurt. Low-fat, low-sodium cheese. Fats and oils Soft margarine without trans fats. Vegetable oil. Low-fat, reduced-fat, or light mayonnaise and salad dressings (reduced-sodium). Canola, safflower, olive, soybean, and sunflower oils. Avocado. Seasoning and other foods Herbs. Spices. Seasoning mixes without salt. Unsalted popcorn and pretzels. Fat-free sweets. What foods are not recommended? The items listed may not be a complete list. Talk with your dietitian about what dietary choices are best for you. Grains Baked goods made with fat, such as croissants, muffins, or some breads. Dry pasta or rice meal packs. Vegetables Creamed or fried vegetables. Vegetables in a cheese sauce. Regular canned vegetables (not low-sodium or reduced-sodium). Regular canned tomato sauce and paste (not low-sodium or reduced-sodium). Regular tomato and vegetable juice (not low-sodium or reduced-sodium). Pickles. Olives. Fruits Canned fruit in a light or heavy syrup. Fried fruit. Fruit in cream or butter sauce. Meat and other protein foods Fatty cuts of meat. Ribs. Fried meat. Bacon. Sausage. Bologna and other processed lunch meats. Salami. Fatback. Hotdogs. Bratwurst. Salted nuts and seeds. Canned beans with added salt. Canned or smoked fish. Whole eggs or egg yolks. Chicken or turkey with skin. Dairy Whole or 2% milk, cream, and half-and-half. Whole or full-fat cream cheese. Whole-fat or sweetened yogurt. Full-fat cheese. Nondairy creamers. Whipped toppings.  Processed cheese and cheese spreads. Fats and oils Butter. Stick margarine. Lard. Shortening. Ghee. Bacon fat. Tropical oils, such as coconut, palm kernel, or palm oil. Seasoning and other foods Salted popcorn and pretzels. Onion salt, garlic salt, seasoned salt, table salt, and sea salt. Worcestershire sauce. Tartar sauce. Barbecue sauce. Teriyaki sauce. Soy sauce, including reduced-sodium. Steak sauce. Canned and packaged gravies. Fish sauce. Oyster sauce. Cocktail sauce. Horseradish that you find on the shelf. Ketchup. Mustard. Meat flavorings and tenderizers. Bouillon cubes. Hot sauce and Tabasco sauce. Premade or packaged marinades. Premade or packaged taco seasonings. Relishes. Regular salad dressings. Where to find more information:  National Heart, Lung, and Blood Institute: www.nhlbi.nih.gov  American Heart Association: www.heart.org Summary  The DASH eating plan is a healthy eating plan that has been shown to reduce high blood pressure (hypertension). It may also reduce your risk for type 2 diabetes, heart disease, and stroke.  With the DASH eating plan, you should limit salt (sodium) intake to 2,300 mg a day. If you have hypertension, you may need to reduce your sodium intake to 1,500 mg a day.  When on the DASH eating plan, aim to eat more fresh fruits and vegetables, whole grains, lean proteins, low-fat dairy, and heart-healthy fats.  Work with your health care provider or diet and nutrition specialist (dietitian) to adjust your eating plan to your individual   calorie needs. This information is not intended to replace advice given to you by your health care provider. Make sure you discuss any questions you have with your health care provider. Document Released: 09/30/2011 Document Revised: 10/04/2016 Document Reviewed: 10/04/2016 Elsevier Interactive Patient Education  2018 Elsevier Inc.  

## 2018-01-25 NOTE — Progress Notes (Signed)
Date:  01/25/2018   Name:  Earl Garcia   DOB:  1966-09-08   MRN:  [id number]   Chief Complaint: Annual Exam Earl Garcia is a 52 y.o. male who presents today for his Complete Annual Exam. He feels well. He reports exercising some - just starting back after back injury. He reports he is sleeping well. Cscope is up to date (done last year).  Hypertension  This is a chronic problem. The problem is unchanged. The problem is controlled. Pertinent negatives include no chest pain, headaches, palpitations or shortness of breath. Past treatments include calcium channel blockers.  Back Pain  This is a new problem. The current episode started more than 1 month ago. The problem occurs intermittently. The problem has been gradually improving since onset. The pain is present in the lumbar spine (MRI - HNP to left at L3-4). Pertinent negatives include no abdominal pain, chest pain, dysuria or headaches.  Hip Pain   There was no injury mechanism. The pain is present in the left hip. The pain is moderate. The pain has been fluctuating since onset.      Review of Systems  Constitutional: Negative for appetite change, chills, diaphoresis, fatigue and unexpected weight change.  HENT: Negative for hearing loss, tinnitus, trouble swallowing and voice change.   Eyes: Negative for visual disturbance.  Respiratory: Negative for choking, shortness of breath and wheezing.   Cardiovascular: Negative for chest pain, palpitations and leg swelling.  Gastrointestinal: Negative for abdominal pain, blood in stool, constipation and diarrhea.  Genitourinary: Negative for difficulty urinating, dysuria and frequency.  Musculoskeletal: Positive for arthralgias (left hip) and back pain. Negative for myalgias.  Skin: Negative for color change and rash.  Neurological: Negative for dizziness, syncope and headaches.  Hematological: Negative for adenopathy.  Psychiatric/Behavioral: Negative for dysphoric mood and sleep  disturbance.    Patient Active Problem List   Diagnosis Date Noted  . HNP (herniated nucleus pulposus), lumbar 01/25/2018  . Muscle weakness 12/26/2017  . Hx of adenomatous colonic polyps 01/19/2017  . Essential (primary) hypertension 08/24/2015  . Irritable bowel syndrome with diarrhea 08/24/2015  . Migraine without aura and responsive to treatment 08/24/2015  . Restless leg 08/24/2015    Prior to Admission medications   Medication Sig Start Date End Date Taking? Authorizing Provider  amLODipine (NORVASC) 10 MG tablet Take 1 tablet (10 mg total) by mouth daily. 08/01/17   Earl Milan, MD  benazepril (LOTENSIN) 20 MG tablet Take 1 tablet (20 mg total) by mouth daily. 08/01/17   Earl Milan, MD       Earl Milan, MD       [provider]       Earl Milan, MD    No Known Allergies  Past Surgical History:  Procedure Laterality Date  . COLONOSCOPY  2013   tubular adenoma  . COLONOSCOPY  03/09/2017   tubular adenoma  . ROTATOR CUFF REPAIR      Social History   Tobacco Use  . Smoking status: Never Smoker  . Smokeless tobacco: Never Used  Substance Use Topics  . Alcohol use: Yes    Alcohol/week: 2.4 oz    Types: 4 Standard drinks or equivalent per week  . Drug use: No     Medication list has been reviewed and updated.  PHQ 2/9 Scores 12/12/2017  PHQ - 2 Score 0    Physical Exam  Constitutional: He is oriented to person, place, and time. He appears well-developed and  well-nourished.  HENT:  Head: Normocephalic.  Right Ear: Tympanic membrane, external ear and ear canal normal.  Left Ear: Tympanic membrane, external ear and ear canal normal.  Nose: Nose normal.  Mouth/Throat: Uvula is midline and oropharynx is clear and moist.  Eyes: Pupils are equal, round, and reactive to light. Conjunctivae and EOM are normal.  Neck: Normal range of motion. Neck supple. Carotid bruit is not present. No thyromegaly present.  Cardiovascular: Normal  rate, regular rhythm, normal heart sounds and intact distal pulses.  Pulmonary/Chest: Effort normal and breath sounds normal. He has no wheezes. Right breast exhibits no mass. Left breast exhibits no mass.  Abdominal: Soft. Normal appearance and bowel sounds are normal. There is no hepatosplenomegaly. There is no tenderness.  Musculoskeletal:       Right hip: Normal.       Left hip: He exhibits decreased range of motion. He exhibits no tenderness and no bony tenderness.       Lumbar back: He exhibits normal range of motion, no tenderness and no bony tenderness.  Lymphadenopathy:    He has no cervical adenopathy.  Neurological: He is alert and oriented to person, place, and time. He has normal strength and normal reflexes. No cranial nerve deficit or sensory deficit.  Skin: Skin is warm, dry and intact.  Psychiatric: He has a normal mood and affect. His speech is normal and behavior is normal. Judgment and thought content normal.  Nursing note and vitals reviewed.   BP (!) 150/92   Pulse 80   Ht 5\' 2"  (1.575 m)   Wt 197 lb (89.4 kg)   SpO2 99%   BMI 36.03 kg/m   Assessment and Plan: 1. Annual physical exam Normal exam except for BP - Lipid panel  2. Prostate cancer screening DRE deferred - PSA  3. Essential (primary) hypertension Not controlled - will increase lotensin Work on diet and weight loss (discussed) - CBC with Differential/Platelet - Comprehensive metabolic panel - POCT urinalysis dipstick - amLODipine (NORVASC) 10 MG tablet; Take 1 tablet (10 mg total) by mouth daily.  Dispense: 90 tablet; Refill: 1 - benazepril (LOTENSIN) 40 MG tablet; Take 1 tablet (40 mg total) by mouth daily.  Dispense: 90 tablet; Refill: 1  4. HNP (herniated nucleus pulposus), lumbar Much improved Continue to protect the back  5. Need for shingles vaccine - Zoster Vaccine Adjuvanted [facility](SHINGRIX) injection; Inject 0.5 mLs into the muscle once for 1 dose.  Dispense: 0.5 mL; Refill: 1  7.  Pain of left hip joint Likely OA Continue tylenol as needed F/u PRN   Meds ordered this encounter  Medications  . amLODipine (NORVASC) 10 MG tablet    Sig: Take 1 tablet (10 mg total) by mouth daily.    Dispense:  90 tablet    Refill:  1  . benazepril (LOTENSIN) 40 MG tablet    Sig: Take 1 tablet (40 mg total) by mouth daily.    Dispense:  90 tablet    Refill:  1  . Zoster Vaccine Adjuvanted [facility](SHINGRIX) injection    Sig: Inject 0.5 mLs into the muscle once for 1 dose.    Dispense:  0.5 mL    Refill:  1    Partially dictated using Animal nutritionistDragon software. Any errors are unintentional.  Bari EdwardLaura Berglund, MD [facility]  01/25/2018

## 2018-01-26 LAB — CBC WITH DIFFERENTIAL/PLATELET
Basophils Absolute: 0 10*3/uL (ref 0.0–0.2)
Basos: 0 %
EOS (ABSOLUTE): 0.1 10*3/uL (ref 0.0–0.4)
EOS: 2 %
HEMATOCRIT: 43.1 % (ref 37.5–51.0)
HEMOGLOBIN: 14.7 g/dL (ref 13.0–17.7)
IMMATURE GRANULOCYTES: 0 %
Immature Grans (Abs): 0 10*3/uL (ref 0.0–0.1)
Lymphocytes Absolute: 1.9 10*3/uL (ref 0.7–3.1)
Lymphs: 41 %
MCH: 28.9 pg (ref 26.6–33.0)
MCHC: 34.1 g/dL (ref 31.5–35.7)
MCV: 85 fL (ref 79–97)
Monocytes Absolute: 0.6 10*3/uL (ref 0.1–0.9)
Monocytes: 13 %
NEUTROS PCT: 44 %
Neutrophils Absolute: 2 10*3/uL (ref 1.4–7.0)
Platelets: 292 10*3/uL (ref 150–379)
RBC: 5.08 x10E6/uL (ref 4.14–5.80)
RDW: 13.5 % (ref 12.3–15.4)
WBC: 4.6 10*3/uL (ref 3.4–10.8)

## 2018-01-26 LAB — COMPREHENSIVE METABOLIC PANEL
ALBUMIN: 4.4 g/dL (ref 3.5–5.5)
ALT: 27 IU/L (ref 0–44)
AST: 20 IU/L (ref 0–40)
Albumin/Globulin Ratio: 1.4 (ref 1.2–2.2)
Alkaline Phosphatase: 83 IU/L (ref 39–117)
BUN / CREAT RATIO: 18 (ref 9–20)
BUN: 14 mg/dL (ref 6–24)
Bilirubin Total: 0.3 mg/dL (ref 0.0–1.2)
CALCIUM: 9.9 mg/dL (ref 8.7–10.2)
CO2: 26 mmol/L (ref 20–29)
CREATININE: 0.8 mg/dL (ref 0.76–1.27)
Chloride: 103 mmol/L (ref 96–106)
GFR calc Af Amer: 119 mL/min/{1.73_m2} (ref >59)
GFR calc non Af Amer: 103 mL/min/{1.73_m2} (ref >59)
GLOBULIN, TOTAL: 3.2 g/dL (ref 1.5–4.5)
Glucose: 96 mg/dL (ref 65–99)
Potassium: 4.5 mmol/L (ref 3.5–5.2)
SODIUM: 143 mmol/L (ref 134–144)
Total Protein: 7.6 g/dL (ref 6.0–8.5)

## 2018-01-26 LAB — LIPID PANEL
Chol/HDL Ratio: 2.6 ratio (ref 0.0–5.0)
Cholesterol, Total: 233 mg/dL — ABNORMAL HIGH (ref 100–199)
HDL: 91 mg/dL (ref >39)
LDL CALC: 130 mg/dL — AB (ref 0–99)
TRIGLYCERIDES: 58 mg/dL (ref 0–149)
VLDL Cholesterol Cal: 12 mg/dL (ref 5–40)

## 2018-01-26 LAB — PSA: PROSTATE SPECIFIC AG, SERUM: 1.9 ng/mL (ref 0.0–4.0)

## 2018-02-03 ENCOUNTER — Other Ambulatory Visit: Payer: Self-pay | Admitting: Internal Medicine

## 2018-02-03 DIAGNOSIS — I10 Essential (primary) hypertension: Secondary | ICD-10-CM

## 2018-02-13 ENCOUNTER — Other Ambulatory Visit: Payer: Self-pay

## 2018-03-13 ENCOUNTER — Telehealth: Payer: Self-pay | Admitting: Internal Medicine

## 2018-03-13 NOTE — Telephone Encounter (Signed)
Pt needs refill sent to [facility]  [place] [facility]   amLODipine (NORVASC) 10 MG tablet [[id number]]    benazepril (LOTENSIN) 40 MG tablet [[id number]]

## 2018-03-15 ENCOUNTER — Ambulatory Visit: Payer: Managed Care, Other (non HMO) | Admitting: Internal Medicine

## 2018-03-17 ENCOUNTER — Encounter: Payer: Self-pay | Admitting: Internal Medicine

## 2018-03-17 ENCOUNTER — Ambulatory Visit: Payer: Managed Care, Other (non HMO) | Admitting: Internal Medicine

## 2018-03-17 VITALS — BP 138/78 | HR 84 | Resp 16 | Ht 62.0 in | Wt 198.0 lb

## 2018-03-17 DIAGNOSIS — I10 Essential (primary) hypertension: Secondary | ICD-10-CM

## 2018-03-17 DIAGNOSIS — G43009 Migraine without aura, not intractable, without status migrainosus: Secondary | ICD-10-CM | POA: Diagnosis not present

## 2018-03-17 MED ORDER — BUTALBITAL-APAP-CAFF-COD 50-325-40-30 MG PO CAPS: 1.0000 | ORAL_CAPSULE | ORAL | 5 refills | Status: DC | PRN

## 2018-03-17 MED ORDER — SUMATRIPTAN SUCCINATE 100 MG PO TABS: 100.0000 mg | ORAL_TABLET | ORAL | 5 refills | Status: DC | PRN

## 2018-03-17 NOTE — Progress Notes (Signed)
Date:  03/17/2018   Name:  Earl Garcia   DOB:  Apr 16, 1966   MRN:  [id number]   Chief Complaint: Migraine ( Just stopped coffe at [employer] after he used to do 3 cups daily -needs refills on Imitrex and Fioricet. Migraine since last Wed has not had in a year)  Migraine   This is a recurrent problem. The current episode started more than 1 year ago. Episode frequency: this migraine has been present for 8 days. The pain is located in the right unilateral region. The pain does not radiate. The quality of the pain is described as aching, stabbing and throbbing. The pain is moderate. Associated symptoms include nausea and photophobia. Pertinent negatives include no dizziness, fever, loss of balance, visual change or vomiting. Treatments tried: fioricet. The treatment provided no relief. His past medical history is significant for hypertension.  Hypertension  This is a chronic problem. The problem has been gradually improving since onset. Associated symptoms include headaches. Pertinent negatives include no chest pain, palpitations or shortness of breath. Past treatments include calcium channel blockers. The current treatment provides significant improvement.     Review of Systems  Constitutional: Negative for fever.  Eyes: Positive for photophobia. Negative for visual disturbance.  Respiratory: Negative for chest tightness and shortness of breath.   Cardiovascular: Negative for chest pain and palpitations.  Gastrointestinal: Positive for nausea. Negative for vomiting.  Neurological: Positive for headaches. Negative for dizziness and loss of balance.    Patient Active Problem List   Diagnosis Date Noted  . HNP (herniated nucleus pulposus), lumbar 01/25/2018  . Pain of left hip joint 01/25/2018  . Muscle weakness 12/26/2017  . Hx of adenomatous colonic polyps 01/19/2017  . Essential (primary) hypertension 08/24/2015  . Irritable bowel syndrome with diarrhea 08/24/2015  . Migraine without  aura and responsive to treatment 08/24/2015  . Restless leg 08/24/2015    Prior to Admission medications   Medication Sig Start Date End Date Taking? Authorizing Provider  amLODipine (NORVASC) 10 MG tablet Take 1 tablet (10 mg total) by mouth daily. 01/25/18  Yes Reubin Milan, MD  aspirin-acetaminophen-caffeine (EXCEDRIN MIGRAINE) [phone] MG tablet Take 2 tablets by mouth every 6 (six) hours as needed for headache.   Yes [provider]  benazepril (LOTENSIN) 40 MG tablet Take 1 tablet (40 mg total) by mouth daily. 01/25/18  Yes Reubin Milan, MD  butalbital-acetaminophen-caffeine (FIORICET WITH CODEINE) [phone] MG capsule Take 1 capsule by mouth every 4 (four) hours as needed for headache.   Yes [provider]  SUMAtriptan (IMITREX) 100 MG tablet Take 100 mg by mouth every 2 (two) hours as needed for migraine. May repeat in 2 hours if headache persists or recurs.   Yes [provider]    No Known Allergies  Past Surgical History:  Procedure Laterality Date  . COLONOSCOPY  2013   tubular adenoma  . COLONOSCOPY  03/09/2017   tubular adenoma  . ROTATOR CUFF REPAIR      Social History   Tobacco Use  . Smoking status: Never Smoker  . Smokeless tobacco: Never Used  Substance Use Topics  . Alcohol use: Yes    Alcohol/week: 2.4 oz    Types: 4 Standard drinks or equivalent per week  . Drug use: No     Medication list has been reviewed and updated.  Current Meds  Medication Sig  . amLODipine (NORVASC) 10 MG tablet Take 1 tablet (10 mg total) by mouth daily.  Marland Kitchen  aspirin-acetaminophen-caffeine (EXCEDRIN MIGRAINE) 250-250-65 MG tablet Take 2 tablets by mouth every 6 (six) hours as needed for headache.  . benazepril (LOTENSIN) 40 MG tablet Take 1 tablet (40 mg total) by mouth daily.  . butalbital-acetaminophen-caffeine (FIORICET WITH CODEINE) 50-325-40-30 MG capsule Take 1 capsule by mouth every 4 (four) hours as needed for headache.  .  SUMAtriptan (IMITREX) 100 MG tablet Take 100 mg by mouth every 2 (two) hours as needed for migraine. May repeat in 2 hours if headache persists or recurs.    PHQ 2/9 Scores 12/12/2017  PHQ - 2 Score 0    Physical Exam  Constitutional: He is oriented to person, place, and time. He appears well-developed. He appears distressed.  HENT:  Head: Normocephalic and atraumatic.  Eyes: Pupils are equal, round, and reactive to light. EOM are normal.  Cardiovascular: Normal rate, regular rhythm and normal heart sounds.  Pulmonary/Chest: Effort normal. No respiratory distress.  Musculoskeletal: Normal range of motion.  Neurological: He is alert and oriented to person, place, and time.  Skin: Skin is warm and dry. No rash noted.  Psychiatric: He has a normal mood and affect. His behavior is normal. Thought content normal.  Nursing note and vitals reviewed.   BP 138/78   Pulse 84   Resp 16   Ht  (1.575 m)   Wt 198 lb (89.8 kg)   SpO2 98%   BMI 36.21 kg/m   Assessment and Plan: 1. Migraine without aura and responsive to treatment Administered sample of nasal Onzetra - after 15 min pt had >50% relief of HA Refill imitrex and fioricet  2. Essential (primary) hypertension Improved, continue current therapy   Meds ordered this encounter  Medications  . SUMAtriptan (IMITREX) 100 MG tablet    Sig: Take 1 tablet (100 mg total) by mouth every 2 (two) hours as needed for migraine. May repeat in 2 hours if headache persists or recurs.    Dispense:  10 tablet    Refill:  5  . butalbital-acetaminophen-caffeine (FIORICET WITH CODEINE) 50-325-40-30 MG capsule    Sig: Take 1 capsule by mouth every 4 (four) hours as needed for headache.    Dispense:  30 capsule    Refill:  5    Partially dictated using Animal nutritionist. Any errors are unintentional.  Bari Edward, MD [facility]  03/17/2018    There are no diagnoses linked to this encounter.

## 2018-07-26 ENCOUNTER — Ambulatory Visit: Payer: Managed Care, Other (non HMO) | Admitting: Internal Medicine

## 2018-08-03 ENCOUNTER — Other Ambulatory Visit: Payer: Self-pay | Admitting: Internal Medicine

## 2018-08-03 ENCOUNTER — Ambulatory Visit (INDEPENDENT_AMBULATORY_CARE_PROVIDER_SITE_OTHER): Payer: Managed Care, Other (non HMO) | Admitting: Internal Medicine

## 2018-08-03 ENCOUNTER — Encounter: Payer: Self-pay | Admitting: Internal Medicine

## 2018-08-03 VITALS — BP 106/72 | HR 84 | Ht 62.0 in | Wt 195.0 lb

## 2018-08-03 DIAGNOSIS — I10 Essential (primary) hypertension: Secondary | ICD-10-CM | POA: Diagnosis not present

## 2018-08-03 DIAGNOSIS — F5101 Primary insomnia: Secondary | ICD-10-CM | POA: Diagnosis not present

## 2018-08-03 DIAGNOSIS — Z23 Encounter for immunization: Secondary | ICD-10-CM | POA: Diagnosis not present

## 2018-08-03 NOTE — Telephone Encounter (Signed)
Pt stated that he wanted to change pharmacies.  Please find out what his new Pharmacy should be so I can send these refills that came from [facility].

## 2018-08-03 NOTE — Patient Instructions (Signed)
Try Melatonin for sleep 5-10 mg at bedtime

## 2018-08-03 NOTE — Progress Notes (Signed)
Date:  08/03/2018   Name:  Earl Garcia   DOB:  [birth year]   MRN:  [id number]   Chief Complaint: Hypertension and Immunizations (Reg dose flu shot)  Hypertension  This is a chronic problem. The problem is controlled. Pertinent negatives include no chest pain, headaches, palpitations or shortness of breath. Past treatments include calcium channel blockers and ACE inhibitors. The current treatment provides significant improvement.  Insomnia  Primary symptoms: sleep disturbance, difficulty falling asleep.  The problem occurs every several days. The problem is unchanged. Treatments tried: nothing.    Review of Systems  Constitutional: Negative for chills and fever.  Eyes: Negative for visual disturbance.  Respiratory: Negative for chest tightness and shortness of breath.   Cardiovascular: Negative for chest pain, palpitations and leg swelling.  Gastrointestinal: Negative for abdominal pain.  Musculoskeletal: Negative for arthralgias.  Allergic/Immunologic: Negative for environmental allergies.  Neurological: Negative for dizziness and headaches.  Psychiatric/Behavioral: Positive for sleep disturbance. The patient has insomnia.     Patient Active Problem List   Diagnosis Date Noted  . HNP (herniated nucleus pulposus), lumbar 01/25/2018  . Pain of left hip joint 01/25/2018  . Muscle weakness 12/26/2017  . Hx of adenomatous colonic polyps 01/19/2017  . Essential (primary) hypertension 08/24/2015  . Irritable bowel syndrome with diarrhea 08/24/2015  . Migraine without aura and responsive to treatment 08/24/2015  . Restless leg 08/24/2015    No Known Allergies  Past Surgical History:  Procedure Laterality Date  . COLONOSCOPY  2013   tubular adenoma  . COLONOSCOPY  03/09/2017   tubular adenoma  . ROTATOR CUFF REPAIR      Social History   Tobacco Use  . Smoking status: Never Smoker  . Smokeless tobacco: Never Used  Substance Use Topics  . Alcohol use: Yes   Alcohol/week: 4.0 standard drinks    Types: 4 Standard drinks or equivalent per week  . Drug use: No     Medication list has been reviewed and updated.  Current Meds  Medication Sig  . amLODipine (NORVASC) 10 MG tablet Take 1 tablet (10 mg total) by mouth daily.  Marland Kitchen aspirin-acetaminophen-caffeine (EXCEDRIN MIGRAINE) 250-250-65 MG tablet Take 2 tablets by mouth every 6 (six) hours as needed for headache.  . benazepril (LOTENSIN) 40 MG tablet Take 1 tablet (40 mg total) by mouth daily.  . butalbital-acetaminophen-caffeine (FIORICET WITH CODEINE) 50-325-40-30 MG capsule Take 1 capsule by mouth every 4 (four) hours as needed for headache.  . SUMAtriptan (IMITREX) 100 MG tablet Take 1 tablet (100 mg total) by mouth every 2 (two) hours as needed for migraine. May repeat in 2 hours if headache persists or recurs.    PHQ 2/9 Scores 12/12/2017  PHQ - 2 Score 0    Physical Exam  Constitutional: He is oriented to person, place, and time. He appears well-developed. No distress.  HENT:  Head: Normocephalic and atraumatic.  Neck: Normal range of motion. Neck supple.  Cardiovascular: Normal rate, regular rhythm and normal heart sounds.  Pulmonary/Chest: Effort normal and breath sounds normal. No respiratory distress.  Musculoskeletal: Normal range of motion. He exhibits no edema or tenderness.  Neurological: He is alert and oriented to person, place, and time.  Skin: Skin is warm and dry. No rash noted.  Psychiatric: He has a normal mood and affect. His behavior is normal. Thought content normal.  Nursing note and vitals reviewed.   BP 106/72 (BP Location: Right Arm, Patient Position: Sitting, Cuff Size: Large)   Pulse 84  Ht 5\' 2"  (1.575 m)   Wt 195 lb (88.5 kg)   SpO2 100%   BMI 35.67 kg/m   Assessment and Plan: 1. Essential (primary) hypertension Controlled, continue current therayp  2. Primary insomnia rec trial of otc melatonin   Partially dictated using Animal nutritionist. Any  errors are unintentional.  Bari Edward, MD [facility]  08/03/2018

## 2018-08-04 NOTE — Telephone Encounter (Signed)
Spoke with patient. He has not decided what pharmacy he is changing to yet. Will callback with new pharmacy. Asked that we do not [facility]. Refused his medications for this request. Waiting patient call for new pharmacy. He said he is not quite out of medication yet.

## 2018-08-21 ENCOUNTER — Other Ambulatory Visit: Payer: Self-pay

## 2018-08-21 DIAGNOSIS — I10 Essential (primary) hypertension: Secondary | ICD-10-CM

## 2018-08-21 MED ORDER — AMLODIPINE BESYLATE 10 MG PO TABS: 10.0000 mg | ORAL_TABLET | Freq: Every day | ORAL | 1 refills | Status: DC

## 2018-08-21 MED ORDER — BENAZEPRIL HCL 40 MG PO TABS: 40.0000 mg | ORAL_TABLET | Freq: Every day | ORAL | 1 refills | Status: DC

## 2018-11-09 ENCOUNTER — Other Ambulatory Visit: Payer: Self-pay | Admitting: Internal Medicine

## 2018-11-09 ENCOUNTER — Telehealth: Payer: Self-pay

## 2018-11-09 DIAGNOSIS — G43009 Migraine without aura, not intractable, without status migrainosus: Secondary | ICD-10-CM

## 2018-11-09 MED ORDER — BUTALBITAL-APAP-CAFFEINE 50-325-40 MG PO TABS: 1.0000 | ORAL_TABLET | Freq: Two times a day (BID) | ORAL | 5 refills | Status: AC | PRN

## 2018-11-09 MED ORDER — SUMATRIPTAN SUCCINATE 100 MG PO TABS: 100.0000 mg | ORAL_TABLET | ORAL | 5 refills | Status: DC | PRN

## 2018-11-09 NOTE — Telephone Encounter (Signed)
Patient called requesting refill for Fioricet and Sumatriptan.  Please Advise.   Wants sent to [facility] in [place].

## 2018-11-09 NOTE — Telephone Encounter (Signed)
Patient informed. 

## 2018-11-09 NOTE — Telephone Encounter (Signed)
Sent to pharmacy 

## 2018-12-18 ENCOUNTER — Other Ambulatory Visit (HOSPITAL_COMMUNITY)
Admission: RE | Admit: 2018-12-18 | Discharge: 2018-12-18 | Disposition: A | Discharge status: Home / Self Care | Payer: Managed Care, Other (non HMO) | Source: Ambulatory Visit | Attending: Internal Medicine | Admitting: Internal Medicine

## 2018-12-18 ENCOUNTER — Ambulatory Visit (INDEPENDENT_AMBULATORY_CARE_PROVIDER_SITE_OTHER): Payer: Managed Care, Other (non HMO) | Admitting: Internal Medicine

## 2018-12-18 ENCOUNTER — Other Ambulatory Visit: Payer: Self-pay

## 2018-12-18 ENCOUNTER — Encounter: Payer: Self-pay | Admitting: Internal Medicine

## 2018-12-18 VITALS — BP 132/70 | HR 89 | Temp 98.6°F | Ht 62.0 in | Wt 193.0 lb

## 2018-12-18 DIAGNOSIS — N341 Nonspecific urethritis: Secondary | ICD-10-CM

## 2018-12-18 DIAGNOSIS — R310 Gross hematuria: Secondary | ICD-10-CM

## 2018-12-18 LAB — POC URINALYSIS WITH MICROSCOPIC (NON AUTO)MANUAL RESULT
Bilirubin, UA: NEGATIVE
CRYSTALS: 0
EPITHELIAL CELLS, URINE PER MICROSCOPY: 2
Glucose, UA: NEGATIVE
Ketones, UA: NEGATIVE
MUCUS UA: 0
Nitrite, UA: NEGATIVE
PROTEIN UA: POSITIVE — AB
RBC: 2 M/uL — AB (ref 4.69–6.13)
Spec Grav, UA: >=1.03 — AB (ref 1.010–1.025)
Urobilinogen, UA: 0.2 E.U./dL
WBC Casts, UA: 4
pH, UA: 6 (ref 5.0–8.0)

## 2018-12-18 MED ORDER — DOXYCYCLINE HYCLATE 100 MG PO TABS: 100.0000 mg | ORAL_TABLET | Freq: Two times a day (BID) | ORAL | 0 refills | Status: AC

## 2018-12-18 NOTE — Progress Notes (Signed)
Date:  12/18/2018   Name:  Earl Garcia   DOB:  07-07-1966   MRN:  [id number]   Chief Complaint: Hematuria (Noticed last night- while urinating had a cough and a stream of blood came out. Blood stopped after applying pressure. This morning noticed blood again. Burning when urinating. )  Hematuria  This is a new problem. The current episode started yesterday. He describes the hematuria as gross hematuria. The hematuria occurs during the terminal portion of his urinary stream. He reports no clotting in his urine stream. The pain is mild. He describes his urine color as tea colored. Irritative symptoms do not include frequency or urgency. Obstructive symptoms do not include dribbling, an intermittent stream or a weak stream. Pertinent negatives include no abdominal pain, chills, dysuria, fever, flank pain or genital pain. He is sexually active. There is no history of BPH, kidney stones, prostatitis, sickle cell disease or STDs.    Review of Systems  Constitutional: Negative for chills, fatigue and fever.  Eyes: Negative for visual disturbance.  Respiratory: Negative for cough, chest tightness, shortness of breath and wheezing.   Cardiovascular: Negative for chest pain and palpitations.  Gastrointestinal: Negative for abdominal pain, constipation and diarrhea.  Genitourinary: Positive for discharge (scant clear drip in his shorts), hematuria and penile pain. Negative for decreased urine volume, difficulty urinating, dysuria, flank pain, frequency, penile swelling and urgency.  Musculoskeletal: Negative for arthralgias and back pain.  Neurological: Negative for dizziness and headaches.    Patient Active Problem List   Diagnosis Date Noted  . HNP (herniated nucleus pulposus), lumbar 01/25/2018  . Pain of left hip joint 01/25/2018  . Muscle weakness 12/26/2017  . Hx of adenomatous colonic polyps 01/19/2017  . Essential (primary) hypertension 08/24/2015  . Irritable bowel syndrome with  diarrhea 08/24/2015  . Migraine without aura and responsive to treatment 08/24/2015  . Restless leg 08/24/2015    No Known Allergies  Past Surgical History:  Procedure Laterality Date  . COLONOSCOPY  2013   tubular adenoma  . COLONOSCOPY  03/09/2017   tubular adenoma  . ROTATOR CUFF REPAIR      Social History   Tobacco Use  . Smoking status: Never Smoker  . Smokeless tobacco: Never Used  Substance Use Topics  . Alcohol use: Yes    Alcohol/week: 4.0 standard drinks    Types: 4 Standard drinks or equivalent per week  . Drug use: No     Medication list has been reviewed and updated.  Current Meds  Medication Sig  . amLODipine (NORVASC) 10 MG tablet Take 1 tablet (10 mg total) by mouth daily.  Marland Kitchen aspirin-acetaminophen-caffeine (EXCEDRIN MIGRAINE) 250-250-65 MG tablet Take 2 tablets by mouth every 6 (six) hours as needed for headache.  . benazepril (LOTENSIN) 40 MG tablet Take 1 tablet (40 mg total) by mouth daily.  . butalbital-acetaminophen-caffeine (FIORICET, ESGIC) 50-325-40 MG tablet Take 1-2 tablets by mouth 2 (two) times daily as needed for headache.  . SUMAtriptan (IMITREX) 100 MG tablet Take 1 tablet (100 mg total) by mouth every 2 (two) hours as needed for migraine. May repeat in 2 hours if headache persists or recurs.    PHQ 2/9 Scores 12/18/2018 12/12/2017  PHQ - 2 Score 0 0    Physical Exam Vitals signs and nursing note reviewed.  Constitutional:      General: He is not in acute distress.    Appearance: He is well-developed.  HENT:     Head: Normocephalic and atraumatic.  Neck:  Musculoskeletal: Normal range of motion and neck supple.  Cardiovascular:     Rate and Rhythm: Normal rate and regular rhythm.  Pulmonary:     Effort: Pulmonary effort is normal. No respiratory distress.     Breath sounds: Normal breath sounds.  Abdominal:     General: Bowel sounds are normal.     Tenderness: There is no abdominal tenderness.  Genitourinary:    Penis:  Circumcised. Discharge (clear discharge) present.      Scrotum/Testes: Normal.     Epididymis:     Right: Normal.     Left: Normal.     Prostate: Enlarged (possible mild enlargement of right lobe). Not tender.     Rectum: Normal.  Musculoskeletal: Normal range of motion.  Skin:    General: Skin is warm and dry.     Findings: No rash.  Neurological:     Mental Status: He is alert and oriented to person, place, and time.  Psychiatric:        Behavior: Behavior normal.        Thought Content: Thought content normal.     BP 132/70   Pulse 89   Temp 98.6 F (37 C) (Oral)   Ht 5\' 2"  (1.575 m)   Wt 193 lb (87.5 kg)   SpO2 98%   BMI 35.30 kg/m   Assessment and Plan: 1. Gross hematuria Suspect urethritis - but rule out Chlamydia If sx worsen, will refer to Urology If unable to void will need to go to ED - GC/Chlamydia probe amp ([place])not at [facility] - POC urinalysis w microscopic (non auto)  2. Urethritis, nonspecific Additional treatment if indicated by testing - doxycycline (VIBRA-TABS) 100 MG tablet; Take 1 tablet (100 mg total) by mouth 2 (two) times daily for 10 days.  Dispense: 20 tablet; Refill: 0   Partially dictated using Animal nutritionist. Any errors are unintentional.  Bari Edward, MD [facility]  12/18/2018

## 2018-12-21 LAB — GC/CHLAMYDIA PROBE AMP (~~LOC~~) NOT AT ARMC
CHLAMYDIA, DNA PROBE: NEGATIVE
Neisseria Gonorrhea: NEGATIVE

## 2019-01-29 ENCOUNTER — Other Ambulatory Visit: Payer: Self-pay

## 2019-01-29 ENCOUNTER — Ambulatory Visit (INDEPENDENT_AMBULATORY_CARE_PROVIDER_SITE_OTHER): Payer: Managed Care, Other (non HMO) | Admitting: Internal Medicine

## 2019-01-29 ENCOUNTER — Encounter: Payer: Self-pay | Admitting: Internal Medicine

## 2019-01-29 VITALS — BP 126/82 | HR 86 | Resp 16 | Ht 62.0 in | Wt 199.0 lb

## 2019-01-29 DIAGNOSIS — Z125 Encounter for screening for malignant neoplasm of prostate: Secondary | ICD-10-CM

## 2019-01-29 DIAGNOSIS — Z0000 Encounter for general adult medical examination without abnormal findings: Secondary | ICD-10-CM

## 2019-01-29 DIAGNOSIS — I10 Essential (primary) hypertension: Secondary | ICD-10-CM

## 2019-01-29 DIAGNOSIS — G43009 Migraine without aura, not intractable, without status migrainosus: Secondary | ICD-10-CM | POA: Diagnosis not present

## 2019-01-29 LAB — POCT URINALYSIS DIPSTICK
Bilirubin, UA: NEGATIVE
Blood, UA: NEGATIVE
Glucose, UA: NEGATIVE
Ketones, UA: NEGATIVE
Leukocytes, UA: NEGATIVE
Nitrite, UA: NEGATIVE
Protein, UA: NEGATIVE
Spec Grav, UA: 1.02 (ref 1.010–1.025)
Urobilinogen, UA: 0.2 E.U./dL
pH, UA: 6.5 (ref 5.0–8.0)

## 2019-01-29 MED ORDER — BENAZEPRIL HCL 40 MG PO TABS: 40.0000 mg | ORAL_TABLET | Freq: Every day | ORAL | 1 refills | Status: DC

## 2019-01-29 MED ORDER — AMLODIPINE BESYLATE 10 MG PO TABS: 10.0000 mg | ORAL_TABLET | Freq: Every day | ORAL | 1 refills | Status: DC

## 2019-01-29 NOTE — Progress Notes (Signed)
Date:  01/29/2019   Name:  Earl Garcia   DOB:  1966/02/12   MRN:  [id number]   Chief Complaint: Annual Exam Earl Garcia is a 53 y.o. male who presents today for his Complete Annual Exam. He feels well. He reports exercising none. He reports he is sleeping well. 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  He is coping well with the Covid-19 epidemic.  Hypertension  This is a chronic problem. The problem is controlled (No more migraines since avoiding caffeine withdrawal). Pertinent negatives include no chest pain, headaches, palpitations or shortness of breath. Past treatments include ACE inhibitors and calcium channel blockers. The current treatment provides significant improvement.  Migraine   This is a new problem. The problem has been resolved. Pertinent negatives include no abdominal pain, back pain, dizziness, hearing loss or tinnitus. His past medical history is significant for hypertension.    Review of Systems  Constitutional: Negative for appetite change, chills, diaphoresis, fatigue and unexpected weight change.  HENT: Negative for hearing loss, tinnitus, trouble swallowing and voice change.   Eyes: Negative for visual disturbance.  Respiratory: Negative for choking, shortness of breath and wheezing.   Cardiovascular: Negative for chest pain, palpitations and leg swelling.  Gastrointestinal: Negative for abdominal pain, blood in stool, constipation and diarrhea.  Genitourinary: Negative for difficulty urinating, dysuria, frequency, hematuria, scrotal swelling and testicular pain.  Musculoskeletal: Negative for arthralgias, back pain and myalgias.  Skin: Negative for color change and rash.  Neurological: Negative for dizziness, syncope and headaches.  Hematological: Negative for adenopathy.  Psychiatric/Behavioral: Negative for dysphoric mood and sleep disturbance.    Patient Active Problem List   Diagnosis Date Noted  . HNP (herniated nucleus  pulposus), lumbar 01/25/2018  . Pain of left hip joint 01/25/2018  . Muscle weakness 12/26/2017  . Hx of adenomatous colonic polyps 01/19/2017  . Essential (primary) hypertension 08/24/2015  . Irritable bowel syndrome with diarrhea 08/24/2015  . Migraine without aura and responsive to treatment 08/24/2015  . Restless leg 08/24/2015    No Known Allergies  Past Surgical History:  Procedure Laterality Date  . COLONOSCOPY  2013   tubular adenoma  . COLONOSCOPY  03/09/2017   tubular adenoma  . ROTATOR CUFF REPAIR      Social History   Tobacco Use  . Smoking status: Never Smoker  . Smokeless tobacco: Never Used  Substance Use Topics  . Alcohol use: Yes    Alcohol/week: 4.0 standard drinks    Types: 4 Standard drinks or equivalent per week  . Drug use: No     Medication list has been reviewed and updated.  Current Meds  Medication Sig  . amLODipine (NORVASC) 10 MG tablet Take 1 tablet (10 mg total) by mouth daily.  Marland Kitchen aspirin-acetaminophen-caffeine (EXCEDRIN MIGRAINE) 250-250-65 MG tablet Take 2 tablets by mouth every 6 (six) hours as needed for headache.  . benazepril (LOTENSIN) 40 MG tablet Take 1 tablet (40 mg total) by mouth daily.  . butalbital-acetaminophen-caffeine (FIORICET, ESGIC) 50-325-40 MG tablet Take 1-2 tablets by mouth 2 (two) times daily as needed for headache.  . SUMAtriptan (IMITREX) 100 MG tablet Take 1 tablet (100 mg total) by mouth every 2 (two) hours as needed for migraine. May repeat in 2 hours if headache persists or recurs.    PHQ 2/9 Scores 01/29/2019 12/18/2018 12/12/2017  PHQ - 2 Score 0 0 0    BP Readings from Last 3 Encounters:  01/29/19 126/82  12/18/18 132/70  08/03/18 106/72    Physical Exam Vitals signs and nursing note reviewed.  Constitutional:      Appearance: Normal appearance. He is well-developed.  HENT:     Head: Normocephalic.     Right Ear: Tympanic membrane, ear canal and external ear normal.     Left Ear: Tympanic  membrane, ear canal and external ear normal.     Nose: Nose normal.     Mouth/Throat:     Pharynx: Uvula midline.  Eyes:     Conjunctiva/sclera: Conjunctivae normal.     Pupils: Pupils are equal, round, and reactive to light.  Neck:     Musculoskeletal: Normal range of motion and neck supple.     Thyroid: No thyromegaly.     Vascular: No carotid bruit.  Cardiovascular:     Rate and Rhythm: Normal rate and regular rhythm.     Heart sounds: Normal heart sounds.  Pulmonary:     Effort: Pulmonary effort is normal.     Breath sounds: Normal breath sounds. No wheezing.  Chest:     Breasts:        Right: No mass.        Left: No mass.  Abdominal:     General: Bowel sounds are normal.     Palpations: Abdomen is soft.     Tenderness: There is no abdominal tenderness.  Musculoskeletal: Normal range of motion.  Lymphadenopathy:     Cervical: No cervical adenopathy.  Skin:    General: Skin is warm and dry.  Neurological:     Mental Status: He is alert and oriented to person, place, and time.     Deep Tendon Reflexes: Reflexes are normal and symmetric.  Psychiatric:        Speech: Speech normal.        Behavior: Behavior normal.        Thought Content: Thought content normal.        Judgment: Judgment normal.     Wt Readings from Last 3 Encounters:  01/29/19 199 lb (90.3 kg)  12/18/18 193 lb (87.5 kg)  08/03/18 195 lb (88.5 kg)    BP 126/82   Pulse 86   Resp 16   Ht 5\' 2"  (1.575 m)   Wt 199 lb (90.3 kg)   SpO2 99%   BMI 36.40 kg/m   Assessment and Plan: 1. Annual physical exam Normal exam except for weight Work on healthy diet and start regular exercise - Lipid panel - POCT urinalysis dipstick  2. Essential (primary) hypertension controlled - amLODipine (NORVASC) 10 MG tablet; Take 1 tablet (10 mg total) by mouth daily.  Dispense: 90 tablet; Refill: 1 - benazepril (LOTENSIN) 40 MG tablet; Take 1 tablet (40 mg total) by mouth daily.  Dispense: 90 tablet; Refill: 1  - CBC with Differential/Platelet - Comprehensive metabolic panel  3. Prostate cancer screening DRE deferred due to lack of symptoms - PSA  4. Migraine without aura and responsive to treatment No headache in months Continue to monitor and follow up if worsening   Partially dictated using Animal nutritionist. Any errors are unintentional.  Bari Edward, MD [facility]  01/29/2019

## 2019-01-29 NOTE — Patient Instructions (Signed)
This information is directly available on the CDC website: https://www.cdc.gov/coronavirus/2019-ncov/if-you-are-sick/steps-when-sick.html    Source:CDC Reference to specific commercial products, manufacturers, companies, or trademarks does not constitute its endorsement or recommendation by the U.S. Government, Department of Health and Human Services, or Centers for Disease Control and Prevention.  

## 2019-01-30 LAB — COMPREHENSIVE METABOLIC PANEL
ALT: 25 IU/L (ref 0–44)
AST: 16 IU/L (ref 0–40)
Albumin/Globulin Ratio: 1.8 (ref 1.2–2.2)
Albumin: 4.6 g/dL (ref 3.8–4.9)
Alkaline Phosphatase: 76 IU/L (ref 39–117)
BUN/Creatinine Ratio: 12 (ref 9–20)
BUN: 10 mg/dL (ref 6–24)
Bilirubin Total: 0.4 mg/dL (ref 0.0–1.2)
CO2: 26 mmol/L (ref 20–29)
Calcium: 9.6 mg/dL (ref 8.7–10.2)
Chloride: 101 mmol/L (ref 96–106)
Creatinine, Ser: 0.81 mg/dL (ref 0.76–1.27)
GFR calc Af Amer: 117 mL/min/{1.73_m2} (ref >59)
GFR calc non Af Amer: 101 mL/min/{1.73_m2} (ref >59)
Globulin, Total: 2.5 g/dL (ref 1.5–4.5)
Glucose: 103 mg/dL — ABNORMAL HIGH (ref 65–99)
Potassium: 4.4 mmol/L (ref 3.5–5.2)
Sodium: 142 mmol/L (ref 134–144)
Total Protein: 7.1 g/dL (ref 6.0–8.5)

## 2019-01-30 LAB — CBC WITH DIFFERENTIAL/PLATELET
Basophils Absolute: 0 10*3/uL (ref 0.0–0.2)
Basos: 1 %
EOS (ABSOLUTE): 0.1 10*3/uL (ref 0.0–0.4)
Eos: 2 %
Hematocrit: 44.2 % (ref 37.5–51.0)
Hemoglobin: 14.8 g/dL (ref 13.0–17.7)
Immature Grans (Abs): 0 10*3/uL (ref 0.0–0.1)
Immature Granulocytes: 0 %
Lymphocytes Absolute: 1.9 10*3/uL (ref 0.7–3.1)
Lymphs: 41 %
MCH: 28.7 pg (ref 26.6–33.0)
MCHC: 33.5 g/dL (ref 31.5–35.7)
MCV: 86 fL (ref 79–97)
Monocytes Absolute: 0.5 10*3/uL (ref 0.1–0.9)
Monocytes: 11 %
Neutrophils Absolute: 2.1 10*3/uL (ref 1.4–7.0)
Neutrophils: 45 %
Platelets: 274 10*3/uL (ref 150–450)
RBC: 5.16 x10E6/uL (ref 4.14–5.80)
RDW: 13.7 % (ref 11.6–15.4)
WBC: 4.6 10*3/uL (ref 3.4–10.8)

## 2019-01-30 LAB — PSA: Prostate Specific Ag, Serum: 3.3 ng/mL (ref 0.0–4.0)

## 2019-01-30 LAB — LIPID PANEL
Chol/HDL Ratio: 2.1 ratio (ref 0.0–5.0)
Cholesterol, Total: 212 mg/dL — ABNORMAL HIGH (ref 100–199)
HDL: 100 mg/dL (ref >39)
LDL Calculated: 102 mg/dL — ABNORMAL HIGH (ref 0–99)
Triglycerides: 48 mg/dL (ref 0–149)
VLDL Cholesterol Cal: 10 mg/dL (ref 5–40)

## 2019-08-02 ENCOUNTER — Ambulatory Visit: Payer: Managed Care, Other (non HMO) | Admitting: Internal Medicine

## 2019-08-31 ENCOUNTER — Other Ambulatory Visit: Payer: Self-pay | Admitting: Internal Medicine

## 2019-08-31 DIAGNOSIS — I10 Essential (primary) hypertension: Secondary | ICD-10-CM

## 2019-11-29 ENCOUNTER — Other Ambulatory Visit: Payer: Self-pay | Admitting: Internal Medicine

## 2019-11-29 DIAGNOSIS — I10 Essential (primary) hypertension: Secondary | ICD-10-CM

## 2020-01-31 ENCOUNTER — Other Ambulatory Visit: Payer: Self-pay

## 2020-01-31 ENCOUNTER — Encounter: Payer: Managed Care, Other (non HMO) | Admitting: Internal Medicine

## 2020-06-03 ENCOUNTER — Ambulatory Visit
Admission: RE | Admit: 2020-06-03 | Discharge: 2020-06-03 | Disposition: A | Discharge status: Home / Self Care | Payer: 59 | Attending: Internal Medicine | Admitting: Internal Medicine

## 2020-06-03 ENCOUNTER — Ambulatory Visit
Admission: RE | Admit: 2020-06-03 | Discharge: 2020-06-03 | Disposition: A | Discharge status: Home / Self Care | Payer: 59 | Source: Ambulatory Visit | Attending: Internal Medicine | Admitting: Internal Medicine

## 2020-06-03 ENCOUNTER — Ambulatory Visit: Payer: 59 | Admitting: Internal Medicine

## 2020-06-03 ENCOUNTER — Other Ambulatory Visit: Payer: Self-pay

## 2020-06-03 ENCOUNTER — Encounter: Payer: Self-pay | Admitting: Internal Medicine

## 2020-06-03 VITALS — BP 132/80 | HR 87 | Ht 62.0 in | Wt 199.8 lb

## 2020-06-03 DIAGNOSIS — M25552 Pain in left hip: Secondary | ICD-10-CM

## 2020-06-03 DIAGNOSIS — G8929 Other chronic pain: Secondary | ICD-10-CM

## 2020-06-03 DIAGNOSIS — M62830 Muscle spasm of back: Secondary | ICD-10-CM | POA: Diagnosis not present

## 2020-06-03 MED ORDER — METHOCARBAMOL 500 MG PO TABS: 500.0000 mg | ORAL_TABLET | Freq: Three times a day (TID) | ORAL | 0 refills | Status: DC

## 2020-06-03 NOTE — Progress Notes (Signed)
Date:  06/03/2020   Name:  Earl Garcia   DOB:  10/23/1966   MRN:  [id number]   Chief Complaint: Back Pain (Lower left back pain. Fell on his L) hip at work. Felt like a muscle was pulling during the pain. Fell 2.5 weeks ago. Now feels better - just wants to get a eval.)  Back Pain This is a new problem. The current episode started 1 to 4 weeks ago. The problem occurs daily. The problem has been gradually improving since onset. Pertinent negatives include no chest pain.  Hip Pain  The injury mechanism was a fall. The pain is present in the left hip.    Lab Results  Component Value Date   CREATININE 0.81 01/29/2019   BUN 10 01/29/2019   NA 142 01/29/2019   K 4.4 01/29/2019   CL 101 01/29/2019   CO2 26 01/29/2019   Lab Results  Component Value Date   CHOL 212 (H) 01/29/2019   HDL 100 01/29/2019   LDLCALC 102 (H) 01/29/2019   TRIG 48 01/29/2019   CHOLHDL 2.1 01/29/2019   Lab Results  Component Value Date   TSH 0.711 08/29/2015   Lab Results  Component Value Date   HGBA1C 5.6 08/29/2015   Lab Results  Component Value Date   WBC 4.6 01/29/2019   HGB 14.8 01/29/2019   HCT 44.2 01/29/2019   MCV 86 01/29/2019   PLT 274 01/29/2019   Lab Results  Component Value Date   ALT 25 01/29/2019   AST 16 01/29/2019   ALKPHOS 76 01/29/2019   BILITOT 0.4 01/29/2019     Review of Systems  Constitutional: Negative for chills and fatigue.  Respiratory: Negative for shortness of breath.   Cardiovascular: Negative for chest pain.  Musculoskeletal: Positive for arthralgias, back pain and gait problem. Negative for joint swelling.  Skin: Negative for color change and rash.  Neurological: Negative for dizziness.    Patient Active Problem List   Diagnosis Date Noted  . HNP (herniated nucleus pulposus), lumbar 01/25/2018  . Pain of left hip joint 01/25/2018  . Hx of adenomatous colonic polyps 01/19/2017  . Essential (primary) hypertension 08/24/2015  . Irritable bowel  syndrome with diarrhea 08/24/2015  . Migraine without aura and responsive to treatment 08/24/2015  . Restless leg 08/24/2015    No Known Allergies  Past Surgical History:  Procedure Laterality Date  . COLONOSCOPY  2013   tubular adenoma  . COLONOSCOPY  03/09/2017   tubular adenoma  . ROTATOR CUFF REPAIR      Social History   Tobacco Use  . Smoking status: Never Smoker  . Smokeless tobacco: Never Used  Substance Use Topics  . Alcohol use: Yes    Alcohol/week: 4.0 standard drinks    Types: 4 Standard drinks or equivalent per week  . Drug use: No     Medication list has been reviewed and updated.  Current Meds  Medication Sig  . amLODipine (NORVASC) 10 MG tablet Take 1 tablet by mouth once daily  . aspirin-acetaminophen-caffeine (EXCEDRIN MIGRAINE) 250-250-65 MG tablet Take 2 tablets by mouth every 6 (six) hours as needed for headache.  . benazepril (LOTENSIN) 40 MG tablet Take 1 tablet by mouth once daily  . SUMAtriptan (IMITREX) 100 MG tablet Take 1 tablet (100 mg total) by mouth every 2 (two) hours as needed for migraine. May repeat in 2 hours if headache persists or recurs.    PHQ 2/9 Scores 06/03/2020 01/29/2019 12/18/2018 12/12/2017  PHQ -  2 Score 0 0 0 0  PHQ- 9 Score 2 - - -    No flowsheet data found.  BP Readings from Last 3 Encounters:  06/03/20 132/80  01/29/19 126/82  12/18/18 132/70    Physical Exam Vitals and nursing note reviewed.  Constitutional:      General: He is not in acute distress.    Appearance: He is well-developed.  HENT:     Head: Normocephalic and atraumatic.  Cardiovascular:     Rate and Rhythm: Normal rate and regular rhythm.  Pulmonary:     Effort: Pulmonary effort is normal. No respiratory distress.     Breath sounds: Normal breath sounds.  Musculoskeletal:     Lumbar back: Spasms (on the left side) present.     Right hip: Normal.     Left hip: Bony tenderness present. Decreased range of motion.  Skin:    General: Skin is  warm and dry.     Findings: No rash.  Neurological:     Mental Status: He is alert and oriented to person, place, and time.     Deep Tendon Reflexes:     Reflex Scores:      Patellar reflexes are 1+ on the right side and 1+ on the left side. Psychiatric:        Behavior: Behavior normal.        Thought Content: Thought content normal.     Wt Readings from Last 3 Encounters:  06/03/20 199 lb 12.8 oz (90.6 kg)  01/29/19 199 lb (90.3 kg)  12/18/18 193 lb (87.5 kg)    BP 132/80   Pulse 87   Ht 5\' 2"  (1.575 m)   Wt 199 lb 12.8 oz (90.6 kg)   SpO2 98%   BMI 36.54 kg/m   Assessment and Plan: 1. Chronic left hip pain Long standing hip pain treated with Aleve and Tylenol Gradually worsening, esp since his fall Will get xray and refer to [facility] if needed - DG Hip Unilat W OR W/O Pelvis 2-3 Views Left; Future  2. Muscle spasm of back Use heat as needed; add Robaxin Continue Aleve and Tylenol - methocarbamol (ROBAXIN) 500 MG tablet; Take 1 tablet (500 mg total) by mouth 3 (three) times daily.  Dispense: 60 tablet; Refill: 0   Partially dictated using . Any errors are unintentional.  Animal nutritionist, MD [facility]  06/03/2020

## 2020-06-04 ENCOUNTER — Other Ambulatory Visit: Payer: Self-pay

## 2020-06-04 DIAGNOSIS — M25552 Pain in left hip: Secondary | ICD-10-CM

## 2020-06-04 DIAGNOSIS — G8929 Other chronic pain: Secondary | ICD-10-CM

## 2020-06-04 DIAGNOSIS — M1612 Unilateral primary osteoarthritis, left hip: Secondary | ICD-10-CM

## 2020-08-06 ENCOUNTER — Telehealth: Payer: 59 | Admitting: Internal Medicine

## 2020-08-06 ENCOUNTER — Other Ambulatory Visit: Payer: Self-pay | Admitting: Internal Medicine

## 2020-08-06 DIAGNOSIS — G43009 Migraine without aura, not intractable, without status migrainosus: Secondary | ICD-10-CM

## 2020-08-06 MED ORDER — SUMATRIPTAN SUCCINATE 100 MG PO TABS: 100.0000 mg | ORAL_TABLET | ORAL | 3 refills | Status: AC | PRN

## 2020-08-06 NOTE — Telephone Encounter (Signed)
Phone call to pt. To inquire about his symptoms.  Stated he has had a headache for a few days, and since yesterday, it has worsened.  Stated his headache is at 7/10.  Has been taking the Excedrin Migraine for pain, and it has eased his pain somewhat, but does not take it away.  Advised will review the Sumatriptan for poss. refill, but the Butalbital-Acetaminophen-Caffeine has expired, and will need to have Dr. Judithann Graves review/ make recommendations. Pt. Verb. Understanding.  Pt. has a Physical scheduled 09/23/2020.

## 2020-08-06 NOTE — Telephone Encounter (Signed)
Requested Prescriptions  Pending Prescriptions Disp Refills  . SUMAtriptan (IMITREX) 100 MG tablet 10 tablet 3    Sig: Take 1 tablet (100 mg total) by mouth every 2 (two) hours as needed for migraine. May repeat in 2 hours if headache persists or recurs.     Neurology:  Migraine Therapy - Triptan Passed - 08/06/2020 12:05 PM      Passed - Last BP in normal range    BP Readings from Last 1 Encounters:  06/03/20 132/80         Passed - Valid encounter within last 12 months    Recent Outpatient Visits          2 months ago Chronic left hip pain   [facility] Reubin Milan, MD   1 year ago Annual physical exam   [facility] Reubin Milan, MD   1 year ago Gross hematuria   [facility] Reubin Milan, MD   2 years ago Essential (primary) hypertension   [facility] Reubin Milan, MD   2 years ago Migraine without aura and responsive to treatment   [facility] Reubin Milan, MD      Future Appointments            In 1 month Judithann Graves Nyoka Cowden, MD [facility]           Refilled Sumtriptan at this time.  Call placed to pt. To discuss his need for Butalbital-Acetaminophen-Caffeine, that he requested, in addition to the Sumatriptan.  C/o migraine headache that has worsened since yesterday; rated 7/10.  Has been taking the Excedrin Migraine with some easing-up of pain, but not resolution of pain.   Will send request to Dr. Judithann Graves to review/ make recommendations on the Butalbital-Acetaminophen-Caffeine.  Next appt. For physical is scheduled 09/23/20

## 2020-08-06 NOTE — Telephone Encounter (Signed)
Medication Refill - Medication: SUMAtriptan (IMITREX) 100 MG tablet butalbital-acetaminophen-caffeine (FIORICET, ESGIC) 50-325-40 MG tablet    Pt is completely out of both medications  Has the patient contacted their pharmacy? Yes.   (Agent: If no, request that the patient contact the pharmacy for the refill.) (Agent: If yes, when and what did the pharmacy advise?)  Preferred Pharmacy (with phone number or street name):  [id number] - [place], [place] - [address] AT [address]  [address] [place] [address]  Phone: [phone] Fax: [phone]     Agent: Please be advised that RX refills may take up to 3 business days. We ask that you follow-up with your pharmacy.

## 2020-08-11 ENCOUNTER — Other Ambulatory Visit: Payer: Self-pay

## 2020-08-11 ENCOUNTER — Encounter: Payer: Self-pay | Admitting: Internal Medicine

## 2020-08-11 ENCOUNTER — Ambulatory Visit (INDEPENDENT_AMBULATORY_CARE_PROVIDER_SITE_OTHER): Payer: 59 | Admitting: Internal Medicine

## 2020-08-11 VITALS — BP 122/78 | HR 76 | Temp 98.5°F | Ht 62.0 in | Wt 199.0 lb

## 2020-08-11 DIAGNOSIS — I10 Essential (primary) hypertension: Secondary | ICD-10-CM

## 2020-08-11 DIAGNOSIS — J069 Acute upper respiratory infection, unspecified: Secondary | ICD-10-CM | POA: Diagnosis not present

## 2020-08-11 NOTE — Progress Notes (Signed)
Date:  08/11/2020   Name:  Earl Garcia   DOB:  [birth year]   MRN:  [id number]   Chief Complaint: Sinusitis (Was having sore throat, wiht outside tenderness on the neck, cough, and sinus congestion. Wife convinced him take Allegra and it has helped his symptoms go away. )  Sinusitis This is a new problem. The current episode started in the past 7 days. The problem has been rapidly improving since onset. There has been no fever. The pain is mild (mostly from lymph nodes in the neck). Associated symptoms include chills, congestion and a sore throat. Pertinent negatives include no coughing, diaphoresis or shortness of breath. Treatments tried: allegra has helped. The treatment provided significant relief.    Lab Results  Component Value Date   CREATININE 0.81 01/29/2019   BUN 10 01/29/2019   NA 142 01/29/2019   K 4.4 01/29/2019   CL 101 01/29/2019   CO2 26 01/29/2019   Lab Results  Component Value Date   CHOL 212 (H) 01/29/2019   HDL 100 01/29/2019   LDLCALC 102 (H) 01/29/2019   TRIG 48 01/29/2019   CHOLHDL 2.1 01/29/2019   Lab Results  Component Value Date   TSH 0.711 08/29/2015   Lab Results  Component Value Date   HGBA1C 5.6 08/29/2015   Lab Results  Component Value Date   WBC 4.6 01/29/2019   HGB 14.8 01/29/2019   HCT 44.2 01/29/2019   MCV 86 01/29/2019   PLT 274 01/29/2019   Lab Results  Component Value Date   ALT 25 01/29/2019   AST 16 01/29/2019   ALKPHOS 76 01/29/2019   BILITOT 0.4 01/29/2019     Review of Systems  Constitutional: Positive for chills and fatigue. Negative for diaphoresis and fever.  HENT: Positive for congestion and sore throat. Negative for trouble swallowing.   Respiratory: Negative for cough, chest tightness and shortness of breath.   Cardiovascular: Negative for chest pain and palpitations.  Hematological: Positive for adenopathy (neck was very tender and swollen).    Patient Active Problem List   Diagnosis Date Noted  .  HNP (herniated nucleus pulposus), lumbar 01/25/2018  . Pain of left hip joint 01/25/2018  . Hx of adenomatous colonic polyps 01/19/2017  . Essential (primary) hypertension 08/24/2015  . Irritable bowel syndrome with diarrhea 08/24/2015  . Migraine without aura and responsive to treatment 08/24/2015  . Restless leg 08/24/2015    No Known Allergies  Past Surgical History:  Procedure Laterality Date  . COLONOSCOPY  2013   tubular adenoma  . COLONOSCOPY  03/09/2017   tubular adenoma  . ROTATOR CUFF REPAIR      Social History   Tobacco Use  . Smoking status: Never Smoker  . Smokeless tobacco: Never Used  Substance Use Topics  . Alcohol use: Yes    Alcohol/week: 4.0 standard drinks    Types: 4 Standard drinks or equivalent per week  . Drug use: No     Medication list has been reviewed and updated.  Current Meds  Medication Sig  . amLODipine (NORVASC) 10 MG tablet Take 1 tablet by mouth once daily  . aspirin-acetaminophen-caffeine (EXCEDRIN MIGRAINE) 250-250-65 MG tablet Take 2 tablets by mouth every 6 (six) hours as needed for headache.  . benazepril (LOTENSIN) 40 MG tablet Take 1 tablet by mouth once daily  . methocarbamol (ROBAXIN) 500 MG tablet Take 1 tablet (500 mg total) by mouth 3 (three) times daily.  . SUMAtriptan (IMITREX) 100 MG tablet Take 1 tablet (  100 mg total) by mouth every 2 (two) hours as needed for migraine. May repeat in 2 hours if headache persists or recurs.    PHQ 2/9 Scores 06/03/2020 01/29/2019 12/18/2018 12/12/2017  PHQ - 2 Score 0 0 0 0  PHQ- 9 Score 2 - - -    No flowsheet data found.  BP Readings from Last 3 Encounters:  08/11/20 122/78  06/03/20 132/80  01/29/19 126/82    Physical Exam Vitals and nursing note reviewed.  Constitutional:      General: He is not in acute distress.    Appearance: Normal appearance. He is well-developed.  HENT:     Head: Normocephalic and atraumatic.     Right Ear: Tympanic membrane and ear canal normal.      Left Ear: Tympanic membrane and ear canal normal.     Nose: Nose normal.     Right Sinus: No maxillary sinus tenderness or frontal sinus tenderness.     Left Sinus: No maxillary sinus tenderness or frontal sinus tenderness.     Mouth/Throat:     Pharynx: Posterior oropharyngeal erythema present.  Neck:     Vascular: No carotid bruit.  Cardiovascular:     Rate and Rhythm: Normal rate and regular rhythm.     Pulses: Normal pulses.  Pulmonary:     Effort: Pulmonary effort is normal. No respiratory distress.     Breath sounds: Normal breath sounds. No wheezing or rhonchi.  Musculoskeletal:        General: Normal range of motion.     Cervical back: Normal range of motion. No tenderness.  Lymphadenopathy:     Cervical: No cervical adenopathy.  Skin:    General: Skin is warm and dry.     Findings: No rash.  Neurological:     Mental Status: He is alert and oriented to person, place, and time.  Psychiatric:        Attention and Perception: Attention normal.     Wt Readings from Last 3 Encounters:  08/11/20 199 lb (90.3 kg)  06/03/20 199 lb 12.8 oz (90.6 kg)  01/29/19 199 lb (90.3 kg)    BP 122/78   Pulse 76   Temp 98.5 F (36.9 C) (Oral)   Ht 5\' 2"  (1.575 m)   Wt 199 lb (90.3 kg)   SpO2 99%   BMI 36.40 kg/m   Assessment and Plan: 1. Viral URI Symptoms almost completely resolved Continue Tylenol as needed for pain Continue Zyrtec daily for PND Call if sx of bacterial infection appear  2. Essential (primary) hypertension Clinically stable exam with well controlled BP on benazepril and amlodipine. Tolerating medications without side effects at this time. Pt to continue current regimen and low sodium diet; benefits of regular exercise as able discussed.   Partially dictated using . Any errors are unintentional.  Animal nutritionist, MD [facility]  08/11/2020

## 2020-09-23 ENCOUNTER — Encounter: Payer: 59 | Admitting: Internal Medicine

## 2020-12-25 ENCOUNTER — Other Ambulatory Visit: Payer: Self-pay | Admitting: Internal Medicine

## 2020-12-25 DIAGNOSIS — I10 Essential (primary) hypertension: Secondary | ICD-10-CM

## 2020-12-25 NOTE — Telephone Encounter (Signed)
Requested medication (s) are due for refill today: Yes  Requested medication (s) are on the active medication list: {Yes  Last refill:  11/30/19  Future visit scheduled: Yes  Notes to clinic: Unable to refill per protocol, Rx expired.      Requested Prescriptions  Pending Prescriptions Disp Refills   benazepril (LOTENSIN) 40 MG tablet [Pharmacy Med Name: Benazepril HCl 40 MG Oral Tablet] 30 tablet 0    Sig: Take 1 tablet by mouth once daily      Cardiovascular:  ACE Inhibitors Failed - 12/25/2020  2:29 PM      Failed - Cr in normal range and within 180 days    Creatinine, Ser  Date Value Ref Range Status  01/29/2019 0.81 0.76 - 1.27 mg/dL Final          Failed - K in normal range and within 180 days    Potassium  Date Value Ref Range Status  01/29/2019 4.4 3.5 - 5.2 mmol/L Final          Passed - Patient is not pregnant      Passed - Last BP in normal range    BP Readings from Last 1 Encounters:  08/11/20 122/78          Passed - Valid encounter within last 6 months    Recent Outpatient Visits           4 months ago Viral URI   [facility] Reubin Milan, MD   6 months ago Chronic left hip pain   [facility] Reubin Milan, MD   1 year ago Annual physical exam   [facility] Reubin Milan, MD   2 years ago Gross hematuria   [facility] Reubin Milan, MD   2 years ago Essential (primary) hypertension   [facility] Reubin Milan, MD       Future Appointments             In 3 weeks Reubin Milan, MD [facility]               amLODipine (NORVASC) 10 MG tablet [Pharmacy Med Name: amLODIPine Besylate 10 MG Oral Tablet] 30 tablet 0    Sig: Take 1 tablet by mouth once daily      Cardiovascular:  Calcium Channel Blockers Passed - 12/25/2020  2:29 PM      Passed - Last BP in normal range    BP Readings from Last 1 Encounters:  08/11/20 122/78          Passed - Valid  encounter within last 6 months    Recent Outpatient Visits           4 months ago Viral URI   [facility] Reubin Milan, MD   6 months ago Chronic left hip pain   [facility] Reubin Milan, MD   1 year ago Annual physical exam   [facility] Reubin Milan, MD   2 years ago Gross hematuria   [facility] Reubin Milan, MD   2 years ago Essential (primary) hypertension   [facility] Reubin Milan, MD       Future Appointments             In 3 weeks Judithann Graves Nyoka Cowden, MD [facility]

## 2021-01-19 NOTE — Progress Notes (Signed)
Date:  01/20/2021   Name:  Earl Garcia   DOB:  1966-07-13   MRN:  [id number]   Chief Complaint: Annual Exam and Back Pain (Back and hip pain. Wants refill on methocarbamol. )  Earl Garcia is a 55 y.o. male who presents today for his Complete Annual Exam. He feels well. He reports exercising - walking a lot for his job. He reports he is sleeping fairly well.   Colonoscopy: 02/2017 repeat 5 yrs  Immunization History  Administered Date(s) Administered  . Influenza,inj,Quad PF,6+ Mos 08/03/2018  . Moderna Sars-Covid-2 Vaccination 02/11/2020, 03/08/2020, 10/25/2020  . Tdap 01/07/2015     Hypertension This is a chronic problem. The problem is controlled. Pertinent negatives include no chest pain, headaches (none in months), palpitations or shortness of breath. Past treatments include ACE inhibitors and calcium channel blockers. The current treatment provides significant improvement. There are no compliance problems.  There is no history of kidney disease, CAD/MI or CVA.  Migraine  This is a recurrent problem. Pertinent negatives include no abdominal pain, back pain, dizziness, hearing loss or tinnitus. He has tried triptans and Excedrin for the symptoms. His past medical history is significant for hypertension.  Hip Pain  The pain is present in the left hip and right knee. He has tried NSAIDs for the symptoms. The treatment provided moderate relief.    Lab Results  Component Value Date   CREATININE 0.81 01/29/2019   BUN 10 01/29/2019   NA 142 01/29/2019   K 4.4 01/29/2019   CL 101 01/29/2019   CO2 26 01/29/2019   Lab Results  Component Value Date   CHOL 212 (H) 01/29/2019   HDL 100 01/29/2019   LDLCALC 102 (H) 01/29/2019   TRIG 48 01/29/2019   CHOLHDL 2.1 01/29/2019   Lab Results  Component Value Date   TSH 0.711 08/29/2015   Lab Results  Component Value Date   HGBA1C 5.6 08/29/2015   Lab Results  Component Value Date   WBC 4.6 01/29/2019   HGB 14.8 01/29/2019    HCT 44.2 01/29/2019   MCV 86 01/29/2019   PLT 274 01/29/2019   Lab Results  Component Value Date   ALT 25 01/29/2019   AST 16 01/29/2019   ALKPHOS 76 01/29/2019   BILITOT 0.4 01/29/2019     Review of Systems  Constitutional: Negative for appetite change, chills, diaphoresis, fatigue and unexpected weight change.  HENT: Negative for hearing loss, tinnitus, trouble swallowing and voice change.   Eyes: Negative for visual disturbance.  Respiratory: Negative for choking, shortness of breath and wheezing.   Cardiovascular: Negative for chest pain, palpitations and leg swelling.  Gastrointestinal: Negative for abdominal pain, blood in stool, constipation and diarrhea.  Genitourinary: Negative for difficulty urinating, dysuria and frequency.  Musculoskeletal: Positive for arthralgias (right knee and left hip). Negative for back pain and myalgias.  Skin: Negative for color change and rash.  Neurological: Negative for dizziness, syncope and headaches (none in months).  Hematological: Negative for adenopathy.  Psychiatric/Behavioral: Negative for dysphoric mood and sleep disturbance.    Patient Active Problem List   Diagnosis Date Noted  . HNP (herniated nucleus pulposus), lumbar 01/25/2018  . Pain of left hip joint 01/25/2018  . Hx of adenomatous colonic polyps 01/19/2017  . Essential (primary) hypertension 08/24/2015  . Irritable bowel syndrome with diarrhea 08/24/2015  . Migraine without aura and responsive to treatment 08/24/2015  . Restless leg 08/24/2015    No Known Allergies  Past Surgical History:  Procedure Laterality Date  . COLONOSCOPY  2013   tubular adenoma  . COLONOSCOPY  03/09/2017   tubular adenoma  . ROTATOR CUFF REPAIR      Social History   Tobacco Use  . Smoking status: Never Smoker  . Smokeless tobacco: Never Used  Substance Use Topics  . Alcohol use: Yes    Alcohol/week: 4.0 standard drinks    Types: 4 Standard drinks or equivalent per week  .  Drug use: No     Medication list has been reviewed and updated.  Current Meds  Medication Sig  . amLODipine (NORVASC) 10 MG tablet Take 1 tablet by mouth once daily  . aspirin-acetaminophen-caffeine (EXCEDRIN MIGRAINE) 250-250-65 MG tablet Take 2 tablets by mouth every 6 (six) hours as needed for headache.  . benazepril (LOTENSIN) 40 MG tablet Take 1 tablet by mouth once daily  . methocarbamol (ROBAXIN) 500 MG tablet Take 1 tablet (500 mg total) by mouth 3 (three) times daily.  . SUMAtriptan (IMITREX) 100 MG tablet Take 1 tablet (100 mg total) by mouth every 2 (two) hours as needed for migraine. May repeat in 2 hours if headache persists or recurs.    PHQ 2/9 Scores 01/20/2021 06/03/2020 01/29/2019 12/18/2018  PHQ - 2 Score 0 0 0 0  PHQ- 9 Score 4 2 - -    GAD 7 : Generalized Anxiety Score 01/20/2021  Nervous, Anxious, on Edge 0  Control/stop worrying 0  Worry too much - different things 0  Trouble relaxing 0  Restless 0  Easily annoyed or irritable 0  Afraid - awful might happen 0  Total GAD 7 Score 0  Anxiety Difficulty Not difficult at all    BP Readings from Last 3 Encounters:  01/20/21 126/88  08/11/20 122/78  06/03/20 132/80    Physical Exam Vitals and nursing note reviewed.  Constitutional:      Appearance: Normal appearance. He is well-developed.  HENT:     Head: Normocephalic.     Right Ear: Tympanic membrane, ear canal and external ear normal.     Left Ear: Tympanic membrane, ear canal and external ear normal.     Nose: Nose normal.  Eyes:     Conjunctiva/sclera: Conjunctivae normal.     Pupils: Pupils are equal, round, and reactive to light.  Neck:     Thyroid: No thyromegaly.     Vascular: No carotid bruit.  Cardiovascular:     Rate and Rhythm: Normal rate and regular rhythm.     Heart sounds: Normal heart sounds.  Pulmonary:     Effort: Pulmonary effort is normal.     Breath sounds: Normal breath sounds. No wheezing.  Chest:  Breasts:     Right: No  mass.     Left: No mass.    Abdominal:     General: Bowel sounds are normal.     Palpations: Abdomen is soft.     Tenderness: There is no abdominal tenderness.  Musculoskeletal:     Cervical back: Normal range of motion and neck supple.     Right knee: No effusion. Normal range of motion. No tenderness.     Left knee: No effusion. Normal range of motion. No tenderness.     Right lower leg: No edema.     Left lower leg: No edema.  Lymphadenopathy:     Cervical: No cervical adenopathy.  Skin:    General: Skin is warm and dry.     Capillary Refill: Capillary refill takes less than 2  seconds.  Neurological:     Mental Status: He is alert and oriented to person, place, and time.     Deep Tendon Reflexes: Reflexes are normal and symmetric.  Psychiatric:        Attention and Perception: Attention normal.        Mood and Affect: Mood normal.        Thought Content: Thought content normal.     Wt Readings from Last 3 Encounters:  01/20/21 201 lb (91.2 kg)  08/11/20 199 lb (90.3 kg)  06/03/20 199 lb 12.8 oz (90.6 kg)    BP 126/88   Pulse 81   Temp 98.5 F (36.9 C) (Oral)   Ht 5\' 2"  (1.575 m)   Wt 201 lb (91.2 kg)   SpO2 98%   BMI 36.76 kg/m   Assessment and Plan: 1. Annual physical exam Exam is normal except for weight. Encourage regular exercise and appropriate dietary changes. Immunizations up to date. He declines Shingrix. - Lipid panel  2. Need for hepatitis C screening test - Hepatitis C antibody  3. Prostate cancer screening DRE deferred - PSA  4. Essential (primary) hypertension Clinically stable exam with controlled BP on lisinopril and amlodipine. Tolerating medications without side effects at this time. Pt to continue current regimen and low sodium diet; benefits of regular exercise as able discussed. Follow up in 6 months - CBC with Differential/Platelet - Comprehensive metabolic panel - POCT urinalysis dipstick - amLODipine (NORVASC) 10 MG tablet;  Take 1 tablet (10 mg total) by mouth daily.  Dispense: 30 tablet; Refill: 5 - benazepril (LOTENSIN) 40 MG tablet; Take 1 tablet (40 mg total) by mouth daily.  Dispense: 30 tablet; Refill: 5  5. Migraine without aura and responsive to treatment No recent headaches  6. Chronic left hip pain Continue NSAIDS as needed Can try glucosamine for hip and knee pain if desired  7. Muscle spasm of back - methocarbamol (ROBAXIN) 500 MG tablet; Take 1 tablet (500 mg total) by mouth 3 (three) times daily.  Dispense: 60 tablet; Refill: 0   Partially dictated using . Any errors are unintentional.  Animal nutritionist, MD [facility]  01/20/2021

## 2021-01-20 ENCOUNTER — Other Ambulatory Visit: Payer: Self-pay

## 2021-01-20 ENCOUNTER — Ambulatory Visit (INDEPENDENT_AMBULATORY_CARE_PROVIDER_SITE_OTHER): Payer: 59 | Admitting: Internal Medicine

## 2021-01-20 ENCOUNTER — Encounter: Payer: Self-pay | Admitting: Internal Medicine

## 2021-01-20 VITALS — BP 126/88 | HR 81 | Temp 98.5°F | Ht 62.0 in | Wt 201.0 lb

## 2021-01-20 DIAGNOSIS — G8929 Other chronic pain: Secondary | ICD-10-CM

## 2021-01-20 DIAGNOSIS — M62830 Muscle spasm of back: Secondary | ICD-10-CM

## 2021-01-20 DIAGNOSIS — Z1159 Encounter for screening for other viral diseases: Secondary | ICD-10-CM | POA: Diagnosis not present

## 2021-01-20 DIAGNOSIS — Z0000 Encounter for general adult medical examination without abnormal findings: Secondary | ICD-10-CM | POA: Diagnosis not present

## 2021-01-20 DIAGNOSIS — I10 Essential (primary) hypertension: Secondary | ICD-10-CM | POA: Diagnosis not present

## 2021-01-20 DIAGNOSIS — Z125 Encounter for screening for malignant neoplasm of prostate: Secondary | ICD-10-CM | POA: Diagnosis not present

## 2021-01-20 DIAGNOSIS — M25552 Pain in left hip: Secondary | ICD-10-CM

## 2021-01-20 DIAGNOSIS — G43009 Migraine without aura, not intractable, without status migrainosus: Secondary | ICD-10-CM

## 2021-01-20 LAB — POCT URINALYSIS DIPSTICK
Bilirubin, UA: NEGATIVE
Blood, UA: NEGATIVE
Glucose, UA: NEGATIVE
Ketones, UA: NEGATIVE
Leukocytes, UA: NEGATIVE
Nitrite, UA: NEGATIVE
Protein, UA: NEGATIVE
Spec Grav, UA: 1.01 (ref 1.010–1.025)
Urobilinogen, UA: 0.2 E.U./dL
pH, UA: 6 (ref 5.0–8.0)

## 2021-01-20 MED ORDER — AMLODIPINE BESYLATE 10 MG PO TABS: 10.0000 mg | ORAL_TABLET | Freq: Every day | ORAL | 5 refills | Status: DC

## 2021-01-20 MED ORDER — METHOCARBAMOL 500 MG PO TABS: 500.0000 mg | ORAL_TABLET | Freq: Three times a day (TID) | ORAL | 0 refills | Status: DC

## 2021-01-20 MED ORDER — BENAZEPRIL HCL 40 MG PO TABS: 40.0000 mg | ORAL_TABLET | Freq: Every day | ORAL | 5 refills | Status: DC

## 2021-01-21 LAB — CBC WITH DIFFERENTIAL/PLATELET
Basophils Absolute: 0 10*3/uL (ref 0.0–0.2)
Basos: 1 %
EOS (ABSOLUTE): 0.2 10*3/uL (ref 0.0–0.4)
Eos: 5 %
Hematocrit: 43.5 % (ref 37.5–51.0)
Hemoglobin: 14.6 g/dL (ref 13.0–17.7)
Immature Grans (Abs): 0 10*3/uL (ref 0.0–0.1)
Immature Granulocytes: 0 %
Lymphocytes Absolute: 1.5 10*3/uL (ref 0.7–3.1)
Lymphs: 41 %
MCH: 28.6 pg (ref 26.6–33.0)
MCHC: 33.6 g/dL (ref 31.5–35.7)
MCV: 85 fL (ref 79–97)
Monocytes Absolute: 0.4 10*3/uL (ref 0.1–0.9)
Monocytes: 12 %
Neutrophils Absolute: 1.5 10*3/uL (ref 1.4–7.0)
Neutrophils: 41 %
Platelets: 301 10*3/uL (ref 150–450)
RBC: 5.1 x10E6/uL (ref 4.14–5.80)
RDW: 13.1 % (ref 11.6–15.4)
WBC: 3.7 10*3/uL (ref 3.4–10.8)

## 2021-01-21 LAB — COMPREHENSIVE METABOLIC PANEL
ALT: 27 IU/L (ref 0–44)
AST: 21 IU/L (ref 0–40)
Albumin/Globulin Ratio: 1.5 (ref 1.2–2.2)
Albumin: 4.6 g/dL (ref 3.8–4.9)
Alkaline Phosphatase: 86 IU/L (ref 44–121)
BUN/Creatinine Ratio: 16 (ref 9–20)
BUN: 13 mg/dL (ref 6–24)
Bilirubin Total: 0.3 mg/dL (ref 0.0–1.2)
CO2: 24 mmol/L (ref 20–29)
Calcium: 9.6 mg/dL (ref 8.7–10.2)
Chloride: 101 mmol/L (ref 96–106)
Creatinine, Ser: 0.81 mg/dL (ref 0.76–1.27)
Globulin, Total: 3 g/dL (ref 1.5–4.5)
Glucose: 96 mg/dL (ref 65–99)
Potassium: 4 mmol/L (ref 3.5–5.2)
Sodium: 142 mmol/L (ref 134–144)
Total Protein: 7.6 g/dL (ref 6.0–8.5)
eGFR: 104 mL/min/{1.73_m2} (ref >59)

## 2021-01-21 LAB — LIPID PANEL
Chol/HDL Ratio: 2.5 ratio (ref 0.0–5.0)
Cholesterol, Total: 215 mg/dL — ABNORMAL HIGH (ref 100–199)
HDL: 85 mg/dL (ref >39)
LDL Chol Calc (NIH): 122 mg/dL — ABNORMAL HIGH (ref 0–99)
Triglycerides: 46 mg/dL (ref 0–149)
VLDL Cholesterol Cal: 8 mg/dL (ref 5–40)

## 2021-01-21 LAB — PSA: Prostate Specific Ag, Serum: 1.6 ng/mL (ref 0.0–4.0)

## 2021-01-21 LAB — HEPATITIS C ANTIBODY: Hep C Virus Ab: <0.1 s/co ratio (ref 0.0–0.9)

## 2021-07-08 ENCOUNTER — Ambulatory Visit: Payer: 59 | Admitting: Internal Medicine

## 2021-07-16 ENCOUNTER — Other Ambulatory Visit: Payer: Self-pay | Admitting: Internal Medicine

## 2021-07-16 DIAGNOSIS — I10 Essential (primary) hypertension: Secondary | ICD-10-CM

## 2021-07-16 NOTE — Telephone Encounter (Signed)
Patient will need an office visit for further refills  

## 2021-08-16 ENCOUNTER — Other Ambulatory Visit: Payer: Self-pay | Admitting: Internal Medicine

## 2021-08-16 DIAGNOSIS — I10 Essential (primary) hypertension: Secondary | ICD-10-CM

## 2021-08-17 NOTE — Telephone Encounter (Signed)
Requested medication (s) are due for refill today: yes  Requested medication (s) are on the active medication list: yes  Last refill:  07/16/21 #30 0 refills  Future visit scheduled: yes in 5 months  Notes to clinic:  do you want greater than 90 day supply courtesy refill ,due to overdue encounter and future visit in 5 months?     Requested Prescriptions  Pending Prescriptions Disp Refills   amLODipine (NORVASC) 10 MG tablet [Pharmacy Med Name: amLODIPine Besylate 10 MG Oral Tablet] 30 tablet 0    Sig: TAKE 1 TABLET BY MOUTH ONCE DAILY . APPOINTMENT REQUIRED FOR FUTURE REFILLS     Cardiovascular:  Calcium Channel Blockers Failed - 08/16/2021  5:06 PM      Failed - Valid encounter within last 6 months    Recent Outpatient Visits           6 months ago Annual physical exam   [facility] Reubin Milan, MD   1 year ago Viral URI   [facility] Reubin Milan, MD   1 year ago Chronic left hip pain   [facility] Reubin Milan, MD   2 years ago Annual physical exam   [facility] Reubin Milan, MD   2 years ago Gross hematuria   [facility] Reubin Milan, MD       Future Appointments             In 5 months Reubin Milan, MD [facility]            Passed - Last BP in normal range    BP Readings from Last 1 Encounters:  01/20/21 126/88           benazepril (LOTENSIN) 40 MG tablet [Pharmacy Med Name: Benazepril HCl 40 MG Oral Tablet] 30 tablet 0    Sig: TAKE 1 TABLET BY MOUTH ONCE DAILY . APPOINTMENT REQUIRED FOR FUTURE REFILLS     Cardiovascular:  ACE Inhibitors Failed - 08/16/2021  5:06 PM      Failed - Cr in normal range and within 180 days    Creatinine, Ser  Date Value Ref Range Status  01/20/2021 0.81 0.76 - 1.27 mg/dL Final          Failed - K in normal range and within 180 days    Potassium  Date Value Ref Range Status  01/20/2021 4.0 3.5 - 5.2 mmol/L Final           Failed - Valid encounter within last 6 months    Recent Outpatient Visits           6 months ago Annual physical exam   [facility] Reubin Milan, MD   1 year ago Viral URI   [facility] Reubin Milan, MD   1 year ago Chronic left hip pain   [facility] Reubin Milan, MD   2 years ago Annual physical exam   [facility] Reubin Milan, MD   2 years ago Gross hematuria   [facility] Reubin Milan, MD       Future Appointments             In 5 months Judithann Graves Nyoka Cowden, MD [facility]            Passed - Patient is not pregnant      Passed - Last BP in normal range  BP Readings from Last 1 Encounters:  01/20/21 126/88

## 2021-08-20 ENCOUNTER — Encounter: Payer: Self-pay | Admitting: Internal Medicine

## 2021-08-20 ENCOUNTER — Ambulatory Visit: Payer: 59 | Admitting: Internal Medicine

## 2021-08-20 ENCOUNTER — Other Ambulatory Visit: Payer: Self-pay

## 2021-08-20 VITALS — BP 134/82 | HR 78 | Ht 62.0 in | Wt 201.4 lb

## 2021-08-20 DIAGNOSIS — N401 Enlarged prostate with lower urinary tract symptoms: Secondary | ICD-10-CM | POA: Diagnosis not present

## 2021-08-20 DIAGNOSIS — G43009 Migraine without aura, not intractable, without status migrainosus: Secondary | ICD-10-CM

## 2021-08-20 DIAGNOSIS — R3912 Poor urinary stream: Secondary | ICD-10-CM | POA: Diagnosis not present

## 2021-08-20 DIAGNOSIS — I10 Essential (primary) hypertension: Secondary | ICD-10-CM | POA: Diagnosis not present

## 2021-08-20 MED ORDER — TAMSULOSIN HCL 0.4 MG PO CAPS: 0.4000 mg | ORAL_CAPSULE | Freq: Every day | ORAL | 3 refills | Status: DC

## 2021-08-20 MED ORDER — AMLODIPINE BESYLATE 10 MG PO TABS: ORAL_TABLET | ORAL | 1 refills | Status: DC

## 2021-08-20 MED ORDER — BENAZEPRIL HCL 40 MG PO TABS
40.0000 mg | ORAL_TABLET | Freq: Every day | ORAL | 1 refills | Status: DC
Start: 2021-08-20 — End: 2022-03-16

## 2021-08-20 NOTE — Progress Notes (Signed)
Date:  08/20/2021   Name:  Earl Garcia   DOB:  [birth year]   MRN:  [id number]   Chief Complaint: Hypertension  Hypertension This is a chronic problem. The problem is controlled. Pertinent negatives include no chest pain, headaches, palpitations or shortness of breath. Past treatments include ACE inhibitors and calcium channel blockers. The current treatment provides significant improvement. There are no compliance problems.  There is no history of kidney disease, CAD/MI or CVA.  Benign Prostatic Hypertrophy This is a new problem. The problem has been gradually worsening since onset. Irritative symptoms include nocturia. Obstructive symptoms include an intermittent stream and a weak stream. Pertinent negatives include no chills or hematuria.   Lab Results  Component Value Date   CREATININE 0.81 01/20/2021   BUN 13 01/20/2021   NA 142 01/20/2021   K 4.0 01/20/2021   CL 101 01/20/2021   CO2 24 01/20/2021   Lab Results  Component Value Date   CHOL 215 (H) 01/20/2021   HDL 85 01/20/2021   LDLCALC 122 (H) 01/20/2021   TRIG 46 01/20/2021   CHOLHDL 2.5 01/20/2021   Lab Results  Component Value Date   TSH 0.711 08/29/2015   Lab Results  Component Value Date   HGBA1C 5.6 08/29/2015   Lab Results  Component Value Date   WBC 3.7 01/20/2021   HGB 14.6 01/20/2021   HCT 43.5 01/20/2021   MCV 85 01/20/2021   PLT 301 01/20/2021   Lab Results  Component Value Date   ALT 27 01/20/2021   AST 21 01/20/2021   ALKPHOS 86 01/20/2021   BILITOT 0.3 01/20/2021     Review of Systems  Constitutional:  Negative for chills, fatigue and unexpected weight change.  HENT:  Negative for nosebleeds.   Eyes:  Negative for visual disturbance.  Respiratory:  Negative for cough, chest tightness, shortness of breath and wheezing.   Cardiovascular:  Negative for chest pain, palpitations and leg swelling.  Gastrointestinal:  Negative for abdominal pain, constipation and diarrhea.   Genitourinary:  Positive for nocturia. Negative for hematuria.  Neurological:  Negative for dizziness, weakness, light-headedness and headaches.   Patient Active Problem List   Diagnosis Date Noted   HNP (herniated nucleus pulposus), lumbar 01/25/2018   Pain of left hip joint 01/25/2018   Hx of adenomatous colonic polyps 01/19/2017   Essential (primary) hypertension 08/24/2015   Irritable bowel syndrome with diarrhea 08/24/2015   Migraine without aura and responsive to treatment 08/24/2015   Restless leg 08/24/2015    No Known Allergies  Past Surgical History:  Procedure Laterality Date   COLONOSCOPY  2013   tubular adenoma   COLONOSCOPY  03/09/2017   tubular adenoma   ROTATOR CUFF REPAIR      Social History   Tobacco Use   Smoking status: Never   Smokeless tobacco: Never  Substance Use Topics   Alcohol use: Yes    Alcohol/week: 4.0 standard drinks    Types: 4 Standard drinks or equivalent per week   Drug use: No     Medication list has been reviewed and updated.  Current Meds  Medication Sig   amLODipine (NORVASC) 10 MG tablet TAKE 1 TABLET BY MOUTH ONCE DAILY . APPOINTMENT REQUIRED FOR FUTURE REFILLS   aspirin-acetaminophen-caffeine (EXCEDRIN MIGRAINE) 250-250-65 MG tablet Take 2 tablets by mouth every 6 (six) hours as needed for headache.   benazepril (LOTENSIN) 40 MG tablet TAKE 1 TABLET BY MOUTH ONCE DAILY . APPOINTMENT REQUIRED FOR FUTURE REFILLS   methocarbamol (  ROBAXIN) 500 MG tablet Take 1 tablet (500 mg total) by mouth 3 (three) times daily.   SUMAtriptan (IMITREX) 100 MG tablet Take 1 tablet (100 mg total) by mouth every 2 (two) hours as needed for migraine. May repeat in 2 hours if headache persists or recurs.    PHQ 2/9 Scores 08/20/2021 01/20/2021 06/03/2020 01/29/2019  PHQ - 2 Score 0 0 0 0  PHQ- 9 Score 1 4 2  -    GAD 7 : Generalized Anxiety Score 08/20/2021 01/20/2021  Nervous, Anxious, on Edge 0 0  Control/stop worrying 0 0  Worry too much -  different things 0 0  Trouble relaxing 0 0  Restless 0 0  Easily annoyed or irritable 0 0  Afraid - awful might happen 0 0  Total GAD 7 Score 0 0  Anxiety Difficulty Not difficult at all Not difficult at all    BP Readings from Last 3 Encounters:  08/20/21 134/82  01/20/21 126/88  08/11/20 122/78    Physical Exam Vitals and nursing note reviewed.  Constitutional:      General: He is not in acute distress.    Appearance: He is well-developed.  HENT:     Head: Normocephalic and atraumatic.  Cardiovascular:     Rate and Rhythm: Normal rate and regular rhythm.     Pulses: Normal pulses.  Pulmonary:     Effort: Pulmonary effort is normal. No respiratory distress.     Breath sounds: No wheezing or rhonchi.  Musculoskeletal:     Cervical back: Normal range of motion.     Right lower leg: No edema.     Left lower leg: No edema.  Skin:    General: Skin is warm and dry.     Findings: No rash.  Neurological:     General: No focal deficit present.     Mental Status: He is alert and oriented to person, place, and time.  Psychiatric:        Mood and Affect: Mood normal.        Behavior: Behavior normal.    Wt Readings from Last 3 Encounters:  08/20/21 201 lb 6.4 oz (91.4 kg)  01/20/21 201 lb (91.2 kg)  08/11/20 199 lb (90.3 kg)    BP 134/82   Pulse 78   Ht 5\' 2"  (1.575 m)   Wt 201 lb 6.4 oz (91.4 kg)   SpO2 98%   BMI 36.84 kg/m   Assessment and Plan: 1. Essential (primary) hypertension Clinically stable exam with well controlled BP. Tolerating medications without side effects at this time. Pt to continue current regimen and low sodium diet; benefits of regular exercise as able discussed. - benazepril (LOTENSIN) 40 MG tablet; Take 1 tablet (40 mg total) by mouth daily.  Dispense: 90 tablet; Refill: 1 - amLODipine (NORVASC) 10 MG tablet; TAKE 1 TABLET BY MOUTH ONCE DAILY . APPOINTMENT REQUIRED FOR FUTURE REFILLS  Dispense: 90 tablet; Refill: 1  2. Migraine without  aura and responsive to treatment Continue otc meds as needed Use imitrex PRN more severe headaches  3. Benign prostatic hyperplasia with weak urinary stream Last PSA was normal - if no improvement at follow up will consider Urology referral. - tamsulosin (FLOMAX) 0.4 MG CAPS capsule; Take 1 capsule (0.4 mg total) by mouth daily.  Dispense: 30 capsule; Refill: 3   Partially dictated using 08/13/20. Any errors are unintentional.  , MD [facility]  08/20/2021

## 2021-09-30 ENCOUNTER — Other Ambulatory Visit: Payer: Self-pay

## 2021-09-30 ENCOUNTER — Telehealth: Payer: Self-pay | Admitting: Internal Medicine

## 2021-09-30 DIAGNOSIS — M1612 Unilateral primary osteoarthritis, left hip: Secondary | ICD-10-CM

## 2021-09-30 DIAGNOSIS — G8929 Other chronic pain: Secondary | ICD-10-CM

## 2021-09-30 NOTE — Telephone Encounter (Signed)
Referral placed for patient for a [facility].

## 2021-09-30 NOTE — Telephone Encounter (Signed)
Referral Request - Has patient seen PCP for this complaint? Yes.   *If NO, is insurance requiring patient see PCP for this issue before PCP can refer them? Referral for which specialty: orthopedic Preferred provider/office: [facility]  Reason for referral: pt has had Xray and would like to move forward w/ referral to a specialist for his left hip.

## 2021-11-02 ENCOUNTER — Telehealth: Payer: Self-pay | Admitting: Internal Medicine

## 2021-11-02 NOTE — Telephone Encounter (Signed)
Pt called to make sure his Xrays where sent to [facility] / he has an appt today at 3:30pm / please advise asap   Office phone number [phone]

## 2021-11-11 ENCOUNTER — Telehealth: Payer: Self-pay | Admitting: Internal Medicine

## 2021-11-11 NOTE — Telephone Encounter (Signed)
Copied from CRM [id number]. Topic: Appointment Scheduling - Scheduling Inquiry for Clinic >> Nov 11, 2021  8:13 AM Crist Infante wrote: Reason for CRM: pt is having severe back pain.  He went to the ED at [facility] yesterday. Pain started early Monday morning. He said they gave him a shot and prescribed lidocaine patches.  But the patches are not working.  Pt wanted appointment today.  Per office, schedule pt Friday or Monday..pt scheduled Friday at 1:40 pm, unless pt can be seen earlier.  Pt was not too happy no appts until Friday.

## 2021-11-13 ENCOUNTER — Encounter: Payer: Self-pay | Admitting: Internal Medicine

## 2021-11-13 ENCOUNTER — Ambulatory Visit: Payer: 59 | Admitting: Internal Medicine

## 2021-11-13 ENCOUNTER — Other Ambulatory Visit: Payer: Self-pay

## 2021-11-13 VITALS — BP 132/80 | HR 93 | Ht 62.0 in | Wt 204.0 lb

## 2021-11-13 DIAGNOSIS — M25552 Pain in left hip: Secondary | ICD-10-CM | POA: Diagnosis not present

## 2021-11-13 DIAGNOSIS — S39012A Strain of muscle, fascia and tendon of lower back, initial encounter: Secondary | ICD-10-CM | POA: Diagnosis not present

## 2021-11-13 DIAGNOSIS — I10 Essential (primary) hypertension: Secondary | ICD-10-CM | POA: Diagnosis not present

## 2021-11-13 MED ORDER — CYCLOBENZAPRINE HCL 10 MG PO TABS: 10.0000 mg | ORAL_TABLET | Freq: Three times a day (TID) | ORAL | 0 refills | Status: DC | PRN

## 2021-11-13 MED ORDER — NAPROXEN 500 MG PO TABS: 500.0000 mg | ORAL_TABLET | Freq: Two times a day (BID) | ORAL | 2 refills | Status: DC

## 2021-11-13 NOTE — Progress Notes (Signed)
Date:  11/13/2021   Name:  Earl Garcia   DOB:  27-Jun-1966   MRN:  [id number]   Chief Complaint: Back Pain (X5 days, mid back and both sides, tried heat and ice, lidocaine patch, went to ER [facility], does not help )  Back Pain This is a chronic problem. The current episode started in the past 7 days. The problem occurs constantly. The problem is unchanged. Pertinent negatives include no chest pain, fever, headaches, numbness or weakness.  He was seen at [facility] three days ago.  Given Toradol injection and one Flexeril po.  Sent home with lidocaine patches.  He is doing a bit better - he can feel the muscles loosening up to some degree.  He is currently only using heat - ran out of Aleve and Robaxin.  INDICATION: Low back pain.   COMPARISON: 12/11/2017.   FINDINGS:   AP, lateral, cone-down views. There are 5 lumbar type vertebral bodies.  Vertebral body heights are maintained. There is degenerative facet disease  of the lower lumbar spine without acute vertebral compression. There is  grade 1 anterolisthesis L5 on S1 appearing similar, possible underlying  pars defects. Mild atherosclerotic plaque deposition within the abdominal  aorta.   IMPRESSION:   Unchanged grade 1 anterolisthesis L5 on S1 with facet degeneration and  possible pars defects. No acute abnormalities.   Electronically Signed by:  Elmore Guise, MD, [facility]  Electronically Signed on:  11/10/2021 9:24 AM Lab Results  Component Value Date   NA 142 01/20/2021   K 4.0 01/20/2021   CO2 24 01/20/2021   GLUCOSE 96 01/20/2021   BUN 13 01/20/2021   CREATININE 0.81 01/20/2021   CALCIUM 9.6 01/20/2021   EGFR 104 01/20/2021   GFRNONAA 101 01/29/2019   Lab Results  Component Value Date   CHOL 215 (H) 01/20/2021   HDL 85 01/20/2021   LDLCALC 122 (H) 01/20/2021   TRIG 46 01/20/2021   CHOLHDL 2.5 01/20/2021   Lab Results  Component Value Date   TSH 0.711 08/29/2015   Lab Results  Component Value Date    HGBA1C 5.6 08/29/2015   Lab Results  Component Value Date   WBC 3.7 01/20/2021   HGB 14.6 01/20/2021   HCT 43.5 01/20/2021   MCV 85 01/20/2021   PLT 301 01/20/2021   Lab Results  Component Value Date   ALT 27 01/20/2021   AST 21 01/20/2021   ALKPHOS 86 01/20/2021   BILITOT 0.3 01/20/2021   No results found for: 25OHVITD2, 25OHVITD3, VD25OH   Review of Systems  Constitutional:  Negative for chills, fatigue and fever.  Respiratory:  Negative for chest tightness and shortness of breath.   Cardiovascular:  Negative for chest pain and leg swelling.  Musculoskeletal:  Positive for arthralgias (hip pain), back pain, gait problem and myalgias.  Neurological:  Negative for dizziness, weakness, light-headedness, numbness and headaches.  Psychiatric/Behavioral:  Negative for dysphoric mood and sleep disturbance. The patient is not nervous/anxious.    Patient Active Problem List   Diagnosis Date Noted   HNP (herniated nucleus pulposus), lumbar 01/25/2018   Pain of left hip joint 01/25/2018   Hx of adenomatous colonic polyps 01/19/2017   Essential (primary) hypertension 08/24/2015   Irritable bowel syndrome with diarrhea 08/24/2015   Migraine without aura and responsive to treatment 08/24/2015   Restless leg 08/24/2015    No Known Allergies  Past Surgical History:  Procedure Laterality Date   COLONOSCOPY  2013   tubular adenoma  COLONOSCOPY  03/09/2017   tubular adenoma   ROTATOR CUFF REPAIR      Social History   Tobacco Use   Smoking status: Never   Smokeless tobacco: Never  Substance Use Topics   Alcohol use: Yes    Alcohol/week: 4.0 standard drinks    Types: 4 Standard drinks or equivalent per week   Drug use: No     Medication list has been reviewed and updated.  Current Meds  Medication Sig   amLODipine (NORVASC) 10 MG tablet TAKE 1 TABLET BY MOUTH ONCE DAILY . APPOINTMENT REQUIRED FOR FUTURE REFILLS   aspirin-acetaminophen-caffeine (EXCEDRIN MIGRAINE)  250-250-65 MG tablet Take 2 tablets by mouth every 6 (six) hours as needed for headache.   benazepril (LOTENSIN) 40 MG tablet Take 1 tablet (40 mg total) by mouth daily.   cyclobenzaprine (FLEXERIL) 10 MG tablet Take 1 tablet (10 mg total) by mouth 3 (three) times daily as needed for muscle spasms.   lidocaine (LIDODERM) 5 % 1 patch daily.   methocarbamol (ROBAXIN) 500 MG tablet Take 1 tablet (500 mg total) by mouth 3 (three) times daily.   naproxen (NAPROSYN) 500 MG tablet Take 1 tablet (500 mg total) by mouth 2 (two) times daily with a meal.   SUMAtriptan (IMITREX) 100 MG tablet Take 1 tablet (100 mg total) by mouth every 2 (two) hours as needed for migraine. May repeat in 2 hours if headache persists or recurs.   tamsulosin (FLOMAX) 0.4 MG CAPS capsule Take 1 capsule (0.4 mg total) by mouth daily.    PHQ 2/9 Scores 11/13/2021 08/20/2021 01/20/2021 06/03/2020  PHQ - 2 Score 0 0 0 0  PHQ- 9 Score _0 GAD 7 : Generalized Anxiety Score 11/13/2021 08/20/2021 01/20/2021  Nervous, Anxious, on Edge 0 0 0  Control/stop worrying 0 0 0  Worry too much - different things 0 0 0  Trouble relaxing 0 0 0  Restless 0 0 0  Easily annoyed or irritable 0 0 0  Afraid - awful might happen 0 0 0  Total GAD 7 Score 0 0 0  Anxiety Difficulty - Not difficult at all Not difficult at all    BP Readings from Last 3 Encounters:  11/13/21 132/80  08/20/21 134/82  01/20/21 126/88    Physical Exam Constitutional:      General: He is in acute distress.  Cardiovascular:     Rate and Rhythm: Normal rate and regular rhythm.  Pulmonary:     Effort: Pulmonary effort is normal.     Breath sounds: No wheezing or rhonchi.  Musculoskeletal:     Cervical back: Normal range of motion.     Lumbar back: Spasms present. No swelling, deformity or bony tenderness. Decreased range of motion. Positive right straight leg raise test and positive left straight leg raise test.  Neurological:     Mental Status: He is  alert.     Motor: Motor function is intact.    Wt Readings from Last 3 Encounters:  11/13/21 204 lb (92.5 kg)  08/20/21 201 lb 6.4 oz (91.4 kg)  01/20/21 201 lb (91.2 kg)    BP 132/80 (BP Location: Right Arm, Cuff Size: Large)    Pulse 93    Ht _1  (1.575 m)    Wt 204 lb (92.5 kg)    SpO2 97%    BMI 37.31 kg/m   Assessment and Plan: 1. Low back strain, initial encounter Continue heat and modified activity Resume Naproxen  and take flexeril bid. - cyclobenzaprine (FLEXERIL) 10 MG tablet; Take 1 tablet (10 mg total) by mouth 3 (three) times daily as needed for muscle spasms.  Dispense: 60 tablet; Refill: 0 - naproxen (NAPROSYN) 500 MG tablet; Take 1 tablet (500 mg total) by mouth 2 (two) times daily with a meal.  Dispense: 60 tablet; Refill: 2  2. Pain of left hip joint Planning L THA in late March. Continue Naproxen.  3. Essential (primary) hypertension Clinically stable exam with well controlled BP. Tolerating medications without side effects at this time. Pt to continue current regimen and low sodium diet  Partially dictated using Editor, commissioning. Any errors are unintentional.  Halina Maidens, MD [facility]  11/13/2021

## 2021-12-11 ENCOUNTER — Ambulatory Visit: Payer: 59 | Admitting: Internal Medicine

## 2021-12-30 DIAGNOSIS — N400 Enlarged prostate without lower urinary tract symptoms: Secondary | ICD-10-CM | POA: Insufficient documentation

## 2021-12-30 DIAGNOSIS — N401 Enlarged prostate with lower urinary tract symptoms: Secondary | ICD-10-CM | POA: Insufficient documentation

## 2022-01-20 HISTORY — PX: TOTAL HIP ARTHROPLASTY: SHX124

## 2022-01-25 ENCOUNTER — Encounter: Payer: 59 | Admitting: Internal Medicine

## 2022-03-16 ENCOUNTER — Other Ambulatory Visit: Payer: Self-pay | Admitting: Internal Medicine

## 2022-03-16 DIAGNOSIS — I10 Essential (primary) hypertension: Secondary | ICD-10-CM

## 2022-03-16 DIAGNOSIS — N401 Enlarged prostate with lower urinary tract symptoms: Secondary | ICD-10-CM

## 2022-03-16 NOTE — Telephone Encounter (Signed)
Pt called to report that he is almost out of his current supply, please advise

## 2022-03-25 IMAGING — CR DG HIP (WITH OR WITHOUT PELVIS) 2-3V*L*
3 series · 3 of 3 positions shown · non-contrast
Comparison: None.

CLINICAL DATA: Left hip pain post fall

EXAM:
DG HIP (WITH OR WITHOUT PELVIS) 2-3V LEFT

[pelvis ap]
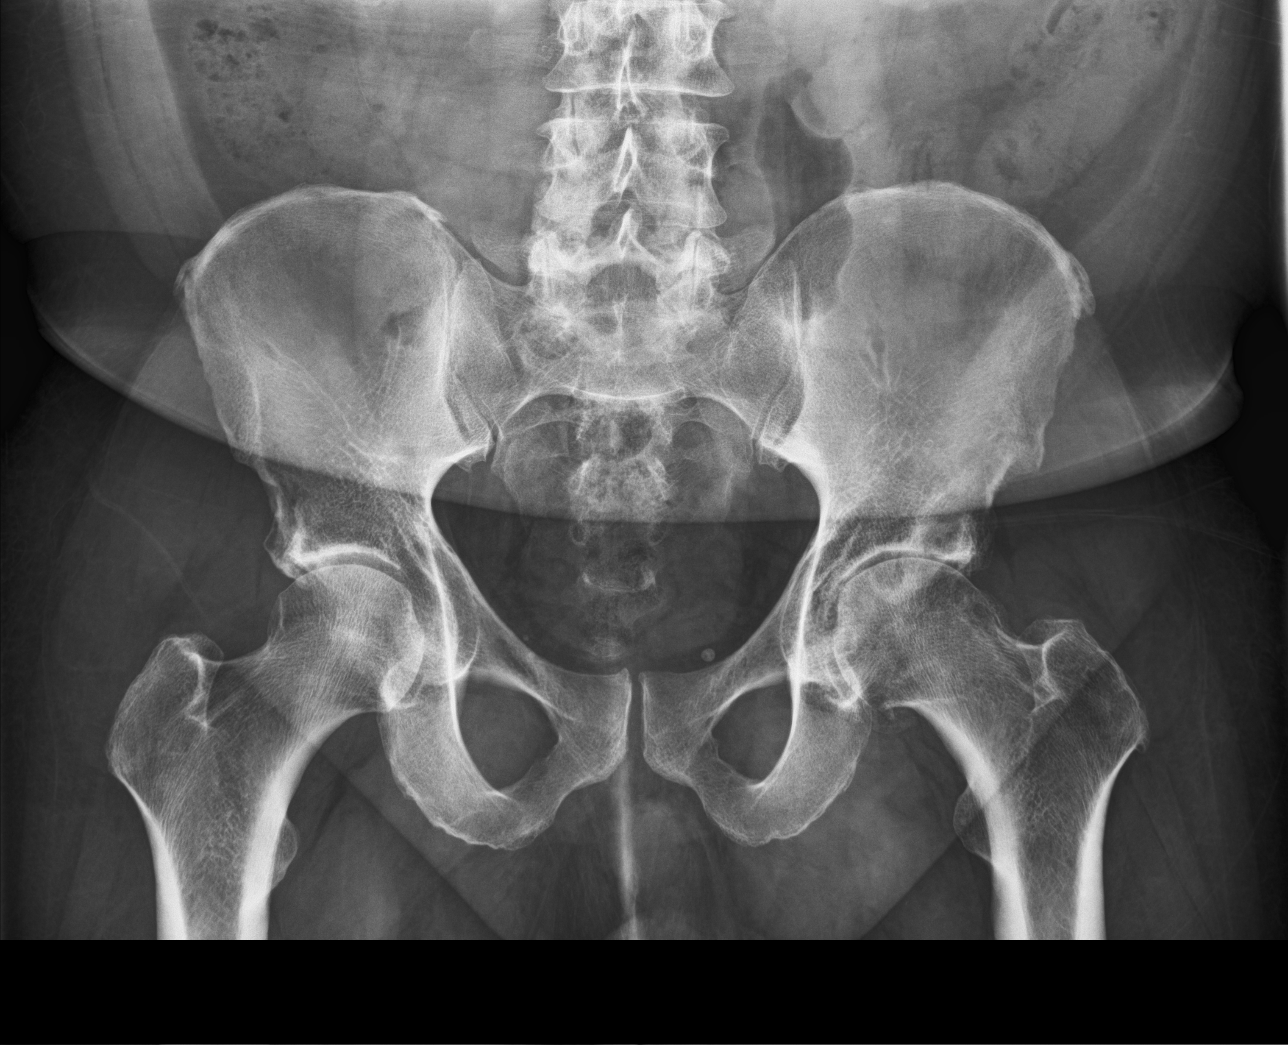

[hip ap]
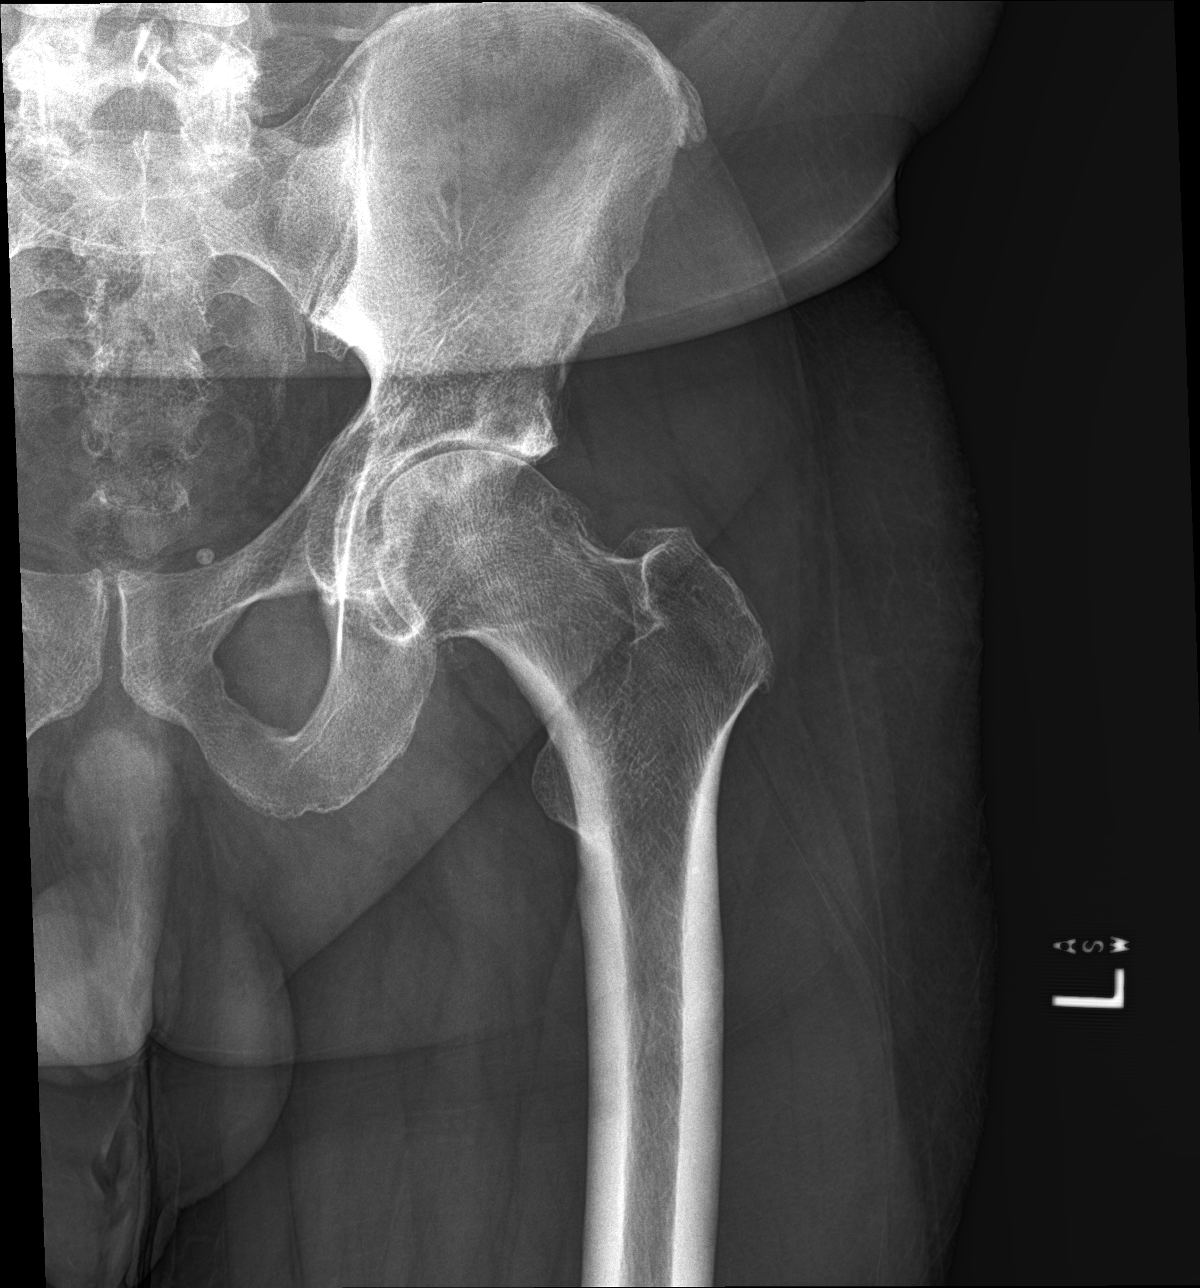

[hip lat]
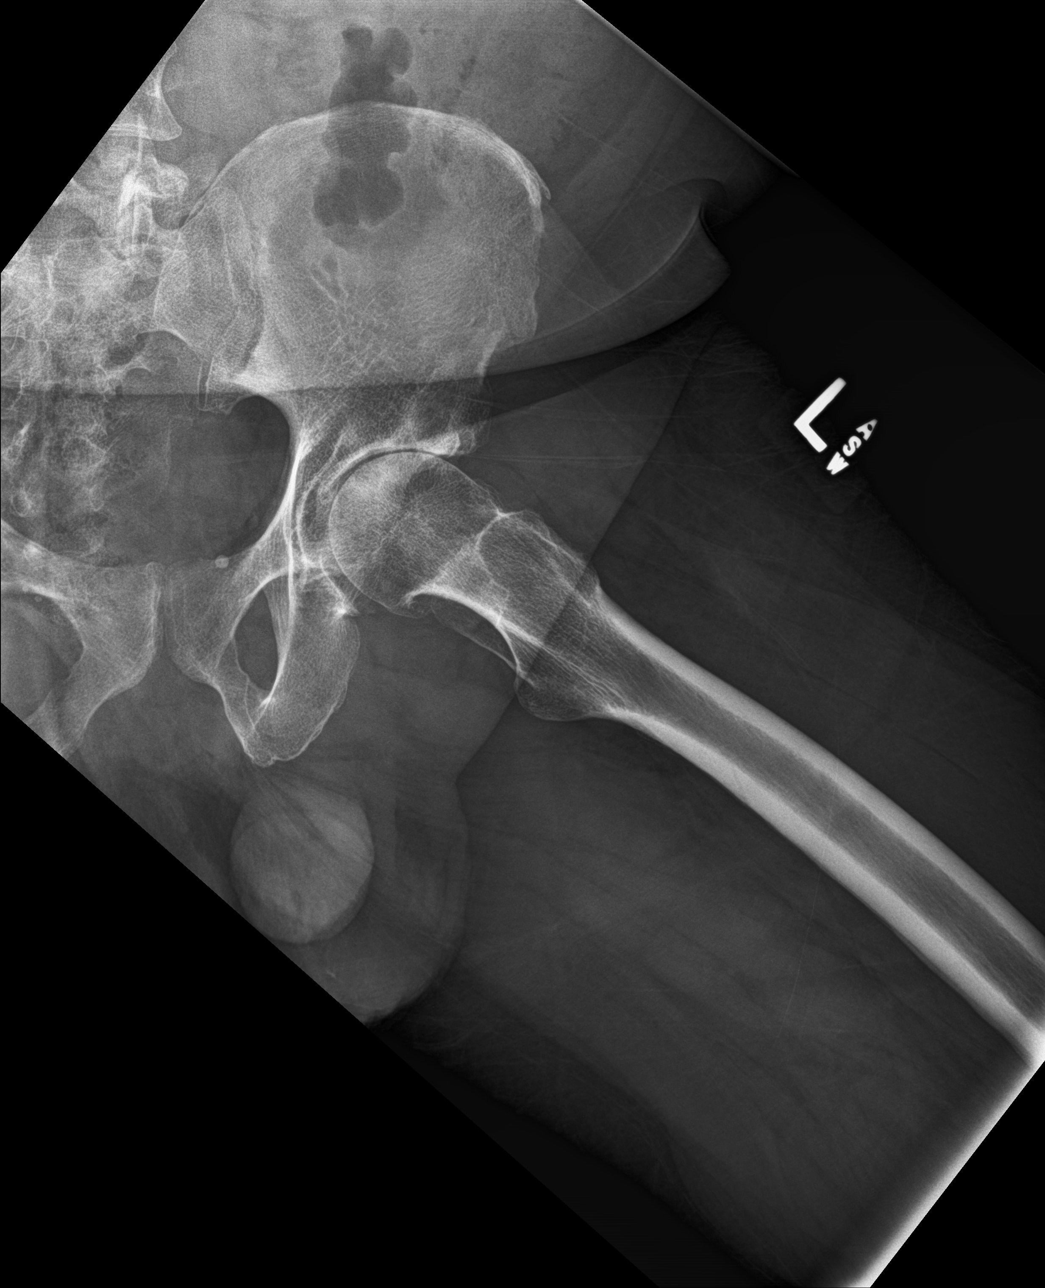

[3 of 3 positions shown; findings below may reference images not displayed]

FINDINGS: Alignment is anatomic. No acute fracture. Degenerative changes are
present with joint space narrowing.
IMPRESSION: No acute fracture.  Osteoarthritis.

## 2022-03-31 ENCOUNTER — Encounter: Payer: Self-pay | Admitting: Internal Medicine

## 2022-03-31 ENCOUNTER — Ambulatory Visit (INDEPENDENT_AMBULATORY_CARE_PROVIDER_SITE_OTHER): Payer: 59 | Admitting: Internal Medicine

## 2022-03-31 VITALS — BP 134/86 | HR 83 | Ht 62.0 in | Wt 206.0 lb

## 2022-03-31 DIAGNOSIS — Z125 Encounter for screening for malignant neoplasm of prostate: Secondary | ICD-10-CM | POA: Diagnosis not present

## 2022-03-31 DIAGNOSIS — E782 Mixed hyperlipidemia: Secondary | ICD-10-CM

## 2022-03-31 DIAGNOSIS — Z0000 Encounter for general adult medical examination without abnormal findings: Secondary | ICD-10-CM | POA: Diagnosis not present

## 2022-03-31 DIAGNOSIS — R3912 Poor urinary stream: Secondary | ICD-10-CM

## 2022-03-31 DIAGNOSIS — G43009 Migraine without aura, not intractable, without status migrainosus: Secondary | ICD-10-CM

## 2022-03-31 DIAGNOSIS — G2581 Restless legs syndrome: Secondary | ICD-10-CM | POA: Diagnosis not present

## 2022-03-31 DIAGNOSIS — Z1211 Encounter for screening for malignant neoplasm of colon: Secondary | ICD-10-CM

## 2022-03-31 DIAGNOSIS — I10 Essential (primary) hypertension: Secondary | ICD-10-CM

## 2022-03-31 DIAGNOSIS — N401 Enlarged prostate with lower urinary tract symptoms: Secondary | ICD-10-CM

## 2022-03-31 LAB — POCT URINALYSIS DIPSTICK
Bilirubin, UA: NEGATIVE
Blood, UA: NEGATIVE
Glucose, UA: NEGATIVE
Ketones, UA: NEGATIVE
Leukocytes, UA: NEGATIVE
Nitrite, UA: NEGATIVE
Protein, UA: POSITIVE — AB
Spec Grav, UA: 1.025 (ref 1.010–1.025)
Urobilinogen, UA: 0.2 E.U./dL
pH, UA: 6 (ref 5.0–8.0)

## 2022-03-31 MED ORDER — AMLODIPINE BESYLATE 10 MG PO TABS: 10.0000 mg | ORAL_TABLET | Freq: Every day | ORAL | 1 refills | Status: DC

## 2022-03-31 MED ORDER — GABAPENTIN 300 MG PO CAPS: 600.0000 mg | ORAL_CAPSULE | Freq: Every day | ORAL | 1 refills | Status: DC

## 2022-03-31 MED ORDER — BENAZEPRIL HCL 40 MG PO TABS: ORAL_TABLET | ORAL | 1 refills | Status: DC

## 2022-03-31 MED ORDER — TAMSULOSIN HCL 0.4 MG PO CAPS: 0.4000 mg | ORAL_CAPSULE | Freq: Every day | ORAL | 1 refills | Status: DC

## 2022-03-31 NOTE — Progress Notes (Signed)
Date:  03/31/2022   Name:  Earl Garcia   DOB:  Aug 17, 1966   MRN:  [id number]   Chief Complaint: Annual Exam Earl Garcia is a 56 y.o. male who presents today for his Complete Annual Exam. He feels well. He reports exercising none. He reports he is sleeping poorly.   Colonoscopy: 02/2017 repeat 5 yrs  Immunization History  Administered Date(s) Administered   Influenza,inj,Quad PF,6+ Mos 08/03/2018   Influenza-Unspecified 08/27/2021   Moderna Sars-Covid-2 Vaccination 02/11/2020, 03/08/2020, 10/25/2020, 09/10/2021   Tdap 01/07/2015   Health Maintenance Due  Topic Date Due   Zoster Vaccines- Shingrix (1 of 2) Never done   COVID-19 Vaccine (5 - Booster for Moderna series) 11/05/2021   COLONOSCOPY (Pts 45-31yr Insurance coverage will need to be confirmed)  03/09/2022    Lab Results  Component Value Date   PSA1 1.6 01/20/2021   PSA1 3.3 01/29/2019   PSA1 1.9 01/25/2018   PSA 0.9 06/18/2014    Hypertension This is a chronic problem. The problem is controlled. Pertinent negatives include no chest pain, headaches, palpitations or shortness of breath. Past treatments include calcium channel blockers and ACE inhibitors.  Benign Prostatic Hypertrophy This is a chronic problem. The problem is unchanged. Irritative symptoms include nocturia. Irritative symptoms do not include frequency or urgency. Obstructive symptoms include a slower stream. Pertinent negatives include no chills or dysuria.  Migraine  This is a recurrent problem. Episode frequency: last migraine about a year ago. The problem has been unchanged. The pain quality is similar to prior headaches. Associated symptoms include insomnia. Pertinent negatives include no abdominal pain, back pain, dizziness, hearing loss or tinnitus. He has tried triptans for the symptoms. His past medical history is significant for hypertension.  Insomnia Primary symptoms: sleep disturbance, frequent awakening.   The problem occurs  intermittently.   Lab Results  Component Value Date   NA 142 01/20/2021   K 4.0 01/20/2021   CO2 24 01/20/2021   GLUCOSE 96 01/20/2021   BUN 13 01/20/2021   CREATININE 0.81 01/20/2021   CALCIUM 9.6 01/20/2021   EGFR 104 01/20/2021   GFRNONAA 101 01/29/2019   Lab Results  Component Value Date   CHOL 215 (H) 01/20/2021   HDL 85 01/20/2021   LDLCALC 122 (H) 01/20/2021   TRIG 46 01/20/2021   CHOLHDL 2.5 01/20/2021   Lab Results  Component Value Date   TSH 0.711 08/29/2015   Lab Results  Component Value Date   HGBA1C 5.6 08/29/2015   Lab Results  Component Value Date   WBC 3.7 01/20/2021   HGB 14.6 01/20/2021   HCT 43.5 01/20/2021   MCV 85 01/20/2021   PLT 301 01/20/2021   Lab Results  Component Value Date   ALT 27 01/20/2021   AST 21 01/20/2021   ALKPHOS 86 01/20/2021   BILITOT 0.3 01/20/2021   No results found for: 25OHVITD2, 25OHVITD3, VD25OH   Review of Systems  Constitutional:  Negative for appetite change, chills, diaphoresis, fatigue and unexpected weight change.  HENT:  Negative for hearing loss, tinnitus, trouble swallowing and voice change.   Eyes:  Negative for visual disturbance.  Respiratory:  Negative for choking, shortness of breath and wheezing.   Cardiovascular:  Negative for chest pain, palpitations and leg swelling.  Gastrointestinal:  Negative for abdominal pain, blood in stool, constipation and diarrhea.  Genitourinary:  Positive for nocturia. Negative for difficulty urinating, dysuria, frequency and urgency.  Musculoskeletal:  Negative for arthralgias, back pain and myalgias.  Skin:  Negative for color change and rash.  Neurological:  Negative for dizziness, syncope and headaches.  Hematological:  Negative for adenopathy.  Psychiatric/Behavioral:  Positive for sleep disturbance. Negative for dysphoric mood. The patient has insomnia. The patient is not nervous/anxious.    Patient Active Problem List   Diagnosis Date Noted   Benign  prostatic hyperplasia 12/30/2021   HNP (herniated nucleus pulposus), lumbar 01/25/2018   Pain of left hip joint 01/25/2018   Hx of adenomatous colonic polyps 01/19/2017   Essential (primary) hypertension 08/24/2015   Irritable bowel syndrome with diarrhea 08/24/2015   Migraine without aura and responsive to treatment 08/24/2015   Restless leg 08/24/2015    No Known Allergies  Past Surgical History:  Procedure Laterality Date   COLONOSCOPY  2013   tubular adenoma   COLONOSCOPY  03/09/2017   tubular adenoma   ROTATOR CUFF REPAIR     TOTAL HIP ARTHROPLASTY Left 01/20/2022    Social History   Tobacco Use   Smoking status: Never   Smokeless tobacco: Never  Substance Use Topics   Alcohol use: Yes    Alcohol/week: 4.0 standard drinks    Types: 4 Standard drinks or equivalent per week   Drug use: No     Medication list has been reviewed and updated.  Current Meds  Medication Sig   aspirin-acetaminophen-caffeine (EXCEDRIN MIGRAINE) 250-250-65 MG tablet Take 2 tablets by mouth every 6 (six) hours as needed for headache.   cyclobenzaprine (FLEXERIL) 10 MG tablet Take 1 tablet (10 mg total) by mouth 3 (three) times daily as needed for muscle spasms.   SUMAtriptan (IMITREX) 100 MG tablet Take 1 tablet (100 mg total) by mouth every 2 (two) hours as needed for migraine. May repeat in 2 hours if headache persists or recurs.   [DISCONTINUED] amLODipine (NORVASC) 10 MG tablet TAKE 1 TABLET BY MOUTH ONCE DAILY . APPOINTMENT REQUIRED FOR FUTURE REFILLS   [DISCONTINUED] benazepril (LOTENSIN) 40 MG tablet TAKE 1 TABLET BY MOUTH ONCE DAILY . APPOINTMENT REQUIRED FOR FUTURE REFILLS   [DISCONTINUED] gabapentin (NEURONTIN) 300 MG capsule Take 300 mg by mouth daily.   [DISCONTINUED] tamsulosin (FLOMAX) 0.4 MG CAPS capsule Take 1 capsule by mouth once daily       03/31/2022    9:37 AM 11/13/2021    1:51 PM 08/20/2021   10:52 AM 01/20/2021    9:58 AM  GAD 7 : Generalized Anxiety Score  Nervous,  Anxious, on Edge 0 0 0 0  Control/stop worrying 0 0 0 0  Worry too much - different things 0 0 0 0  Trouble relaxing 0 0 0 0  Restless 0 0 0 0  Easily annoyed or irritable 0 0 0 0  Afraid - awful might happen 0 0 0 0  Total GAD 7 Score 0 0 0 0  Anxiety Difficulty   Not difficult at all Not difficult at all       03/31/2022    9:36 AM  Depression screen PHQ 2/9  Decreased Interest 0  Down, Depressed, Hopeless 0  PHQ - 2 Score 0  Altered sleeping 3  Tired, decreased energy 1  Change in appetite 0  Feeling bad or failure about yourself  0  Trouble concentrating 0  Moving slowly or fidgety/restless 0  Suicidal thoughts 0  PHQ-9 Score 4  Difficult doing work/chores Not difficult at all    BP Readings from Last 3 Encounters:  03/31/22 134/86  11/13/21 132/80  08/20/21 134/82    Physical Exam Vitals  and nursing note reviewed.  Constitutional:      Appearance: Normal appearance. He is well-developed.  HENT:     Head: Normocephalic.     Right Ear: Tympanic membrane, ear canal and external ear normal.     Left Ear: Tympanic membrane, ear canal and external ear normal.     Nose: Nose normal.  Eyes:     Conjunctiva/sclera: Conjunctivae normal.     Pupils: Pupils are equal, round, and reactive to light.  Neck:     Thyroid: No thyromegaly.     Vascular: No carotid bruit.  Cardiovascular:     Rate and Rhythm: Normal rate and regular rhythm.     Heart sounds: Normal heart sounds.  Pulmonary:     Effort: Pulmonary effort is normal.     Breath sounds: Normal breath sounds. No wheezing.  Chest:  Breasts:    Right: No mass.     Left: No mass.  Abdominal:     General: Bowel sounds are normal.     Palpations: Abdomen is soft.     Tenderness: There is no abdominal tenderness.  Musculoskeletal:        General: Normal range of motion.     Cervical back: Normal range of motion and neck supple.  Lymphadenopathy:     Cervical: No cervical adenopathy.  Skin:    General: Skin is  warm and dry.  Neurological:     Mental Status: He is alert and oriented to person, place, and time.     Deep Tendon Reflexes: Reflexes are normal and symmetric.  Psychiatric:        Attention and Perception: Attention normal.        Mood and Affect: Mood normal.        Thought Content: Thought content normal.    Wt Readings from Last 3 Encounters:  03/31/22 206 lb (93.4 kg)  11/13/21 204 lb (92.5 kg)  08/20/21 201 lb 6.4 oz (91.4 kg)    BP 134/86 (BP Location: Right Arm, Cuff Size: Large)   Pulse 83   Ht '5\' 2"'  (1.575 m)   Wt 206 lb (93.4 kg)   SpO2 95%   BMI 37.68 kg/m   Assessment and Plan: 1. Annual physical exam Exam is normal except for weight. Encourage regular exercise and appropriate dietary changes. Up to date on screenings and immunizations. Due for colonscopy - Ambulatory referral to Gastroenterology  2. Colon cancer screening - Ambulatory referral to Gastroenterology  3. Prostate cancer screening DRE deferred - on tamsulosin - PSA  4. Essential (primary) hypertension Clinically stable exam with well controlled BP. Tolerating medications without side effects at this time. Pt to continue current regimen and low sodium diet; benefits of regular exercise as able discussed. - CBC with Differential/Platelet - Comprehensive metabolic panel - POCT urinalysis dipstick - amLODipine (NORVASC) 10 MG tablet; Take 1 tablet (10 mg total) by mouth daily.  Dispense: 90 tablet; Refill: 1 - benazepril (LOTENSIN) 40 MG tablet; TAKE 1 TABLET BY MOUTH ONCE DAILY . APPOINTMENT REQUIRED FOR FUTURE REFILLS  Dispense: 90 tablet; Refill: 1  5. Migraine without aura and responsive to treatment No recent migraines Has imitrex to use PRN  6. Mixed hyperlipidemia Check labs; continue low fat diet - Lipid panel  7. Restless leg syndrome Partial response to 300 mg gabapentin Will try higher dose - may also help with sleep - gabapentin (NEURONTIN) 300 MG capsule; Take 2 capsules  (600 mg total) by mouth daily.  Dispense: 180 capsule; Refill:  1  8. Benign prostatic hyperplasia with weak urinary stream Symptoms well controlled. - tamsulosin (FLOMAX) 0.4 MG CAPS capsule; Take 1 capsule (0.4 mg total) by mouth daily.  Dispense: 90 capsule; Refill: 1   Partially dictated using Editor, commissioning. Any errors are unintentional.  Halina Maidens, MD [facility]  03/31/2022

## 2022-04-01 ENCOUNTER — Other Ambulatory Visit: Payer: Self-pay

## 2022-04-01 ENCOUNTER — Encounter: Payer: Self-pay | Admitting: Internal Medicine

## 2022-04-01 DIAGNOSIS — E782 Mixed hyperlipidemia: Secondary | ICD-10-CM | POA: Insufficient documentation

## 2022-04-01 LAB — CBC WITH DIFFERENTIAL/PLATELET
Basophils Absolute: 0 10*3/uL (ref 0.0–0.2)
Basos: 1 %
EOS (ABSOLUTE): 0.2 10*3/uL (ref 0.0–0.4)
Eos: 4 %
Hematocrit: 44.6 % (ref 37.5–51.0)
Hemoglobin: 15.3 g/dL (ref 13.0–17.7)
Immature Grans (Abs): 0 10*3/uL (ref 0.0–0.1)
Immature Granulocytes: 0 %
Lymphocytes Absolute: 1.8 10*3/uL (ref 0.7–3.1)
Lymphs: 42 %
MCH: 29.3 pg (ref 26.6–33.0)
MCHC: 34.3 g/dL (ref 31.5–35.7)
MCV: 85 fL (ref 79–97)
Monocytes Absolute: 0.5 10*3/uL (ref 0.1–0.9)
Monocytes: 11 %
Neutrophils Absolute: 1.8 10*3/uL (ref 1.4–7.0)
Neutrophils: 42 %
Platelets: 266 10*3/uL (ref 150–450)
RBC: 5.22 x10E6/uL (ref 4.14–5.80)
RDW: 14 % (ref 11.6–15.4)
WBC: 4.3 10*3/uL (ref 3.4–10.8)

## 2022-04-01 LAB — COMPREHENSIVE METABOLIC PANEL
ALT: 20 IU/L (ref 0–44)
AST: 14 IU/L (ref 0–40)
Albumin/Globulin Ratio: 1.5 (ref 1.2–2.2)
Albumin: 4.5 g/dL (ref 3.8–4.9)
Alkaline Phosphatase: 117 IU/L (ref 44–121)
BUN/Creatinine Ratio: 14 (ref 9–20)
BUN: 12 mg/dL (ref 6–24)
Bilirubin Total: 0.3 mg/dL (ref 0.0–1.2)
CO2: 24 mmol/L (ref 20–29)
Calcium: 9.4 mg/dL (ref 8.7–10.2)
Chloride: 103 mmol/L (ref 96–106)
Creatinine, Ser: 0.86 mg/dL (ref 0.76–1.27)
Globulin, Total: 3 g/dL (ref 1.5–4.5)
Glucose: 109 mg/dL — ABNORMAL HIGH (ref 70–99)
Potassium: 3.6 mmol/L (ref 3.5–5.2)
Sodium: 140 mmol/L (ref 134–144)
Total Protein: 7.5 g/dL (ref 6.0–8.5)
eGFR: 102 mL/min/{1.73_m2} (ref >59)

## 2022-04-01 LAB — LIPID PANEL
Chol/HDL Ratio: 3 ratio (ref 0.0–5.0)
Cholesterol, Total: 211 mg/dL — ABNORMAL HIGH (ref 100–199)
HDL: 70 mg/dL (ref >39)
LDL Chol Calc (NIH): 130 mg/dL — ABNORMAL HIGH (ref 0–99)
Triglycerides: 59 mg/dL (ref 0–149)
VLDL Cholesterol Cal: 11 mg/dL (ref 5–40)

## 2022-04-01 LAB — PSA: Prostate Specific Ag, Serum: 2.3 ng/mL (ref 0.0–4.0)

## 2022-04-01 MED ORDER — ATORVASTATIN CALCIUM 10 MG PO TABS: 10.0000 mg | ORAL_TABLET | Freq: Every day | ORAL | 3 refills | Status: DC

## 2022-07-29 ENCOUNTER — Other Ambulatory Visit: Payer: Self-pay | Admitting: Internal Medicine

## 2022-07-29 DIAGNOSIS — I10 Essential (primary) hypertension: Secondary | ICD-10-CM

## 2022-07-30 NOTE — Telephone Encounter (Signed)
Refilled 03/31/2022 #[id number] Requested Prescriptions  Pending Prescriptions Disp Refills  . benazepril (LOTENSIN) 40 MG tablet [Pharmacy Med Name: Benazepril HCl 40 MG Oral Tablet] 90 tablet 0    Sig: TAKE 1 TABLET BY MOUTH ONCE DAILY . APPOINTMENT REQUIRED FOR FUTURE REFILLS     Cardiovascular:  ACE Inhibitors Passed - 07/29/2022  2:55 PM      Passed - Cr in normal range and within 180 days    Creatinine, Ser  Date Value Ref Range Status  03/31/2022 0.86 0.76 - 1.27 mg/dL Final         Passed - K in normal range and within 180 days    Potassium  Date Value Ref Range Status  03/31/2022 3.6 3.5 - 5.2 mmol/L Final         Passed - Patient is not pregnant      Passed - Last BP in normal range    BP Readings from Last 1 Encounters:  03/31/22 134/86         Passed - Valid encounter within last 6 months    Recent Outpatient Visits          4 months ago Annual physical exam   [place] [facility], Jesse Sans, MD   8 months ago Low back strain, initial encounter   [place] [facility], Jesse Sans, MD   11 months ago Essential (primary) hypertension   [place] [facility] at [facility], Jesse Sans, MD   1 year ago Annual physical exam   [facility] at [facility], Jesse Sans, MD   1 year ago Viral URI   [facility] at [facility], Jesse Sans, MD      Future Appointments            In 6 days Army Melia Jesse Sans, MD [facility] at [facility], [facility]   In 8 months Army Melia, Jesse Sans, MD [place] [facility] at [facility]

## 2022-08-05 ENCOUNTER — Ambulatory Visit: Payer: 59 | Admitting: Internal Medicine

## 2022-09-13 ENCOUNTER — Encounter: Payer: Self-pay | Admitting: Internal Medicine

## 2022-09-13 ENCOUNTER — Ambulatory Visit (INDEPENDENT_AMBULATORY_CARE_PROVIDER_SITE_OTHER): Payer: 59 | Admitting: Internal Medicine

## 2022-09-13 VITALS — BP 132/82 | HR 87 | Ht 62.0 in | Wt 204.0 lb

## 2022-09-13 DIAGNOSIS — Z01818 Encounter for other preprocedural examination: Secondary | ICD-10-CM

## 2022-09-13 DIAGNOSIS — Z23 Encounter for immunization: Secondary | ICD-10-CM | POA: Diagnosis not present

## 2022-09-13 DIAGNOSIS — I10 Essential (primary) hypertension: Secondary | ICD-10-CM | POA: Diagnosis not present

## 2022-09-13 DIAGNOSIS — G2581 Restless legs syndrome: Secondary | ICD-10-CM

## 2022-09-13 MED ORDER — AMLODIPINE BESYLATE 10 MG PO TABS: 10.0000 mg | ORAL_TABLET | Freq: Every day | ORAL | 3 refills | Status: DC

## 2022-09-13 MED ORDER — BENAZEPRIL HCL 40 MG PO TABS: ORAL_TABLET | ORAL | 3 refills | Status: DC

## 2022-09-13 MED ORDER — GABAPENTIN 300 MG PO CAPS: 600.0000 mg | ORAL_CAPSULE | Freq: Every day | ORAL | 3 refills | Status: DC

## 2022-09-13 NOTE — Progress Notes (Signed)
Date:  09/13/2022   Name:  Earl Garcia   DOB:  08-26-1966   MRN:  [id number]   Chief Complaint: Pre-op Exam Pre-op for shoulder surgery.  Right rotator cuff repair on 10/05/22.  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  He is on light duty at present.  Hypertension This is a chronic problem. The problem is controlled. Pertinent negatives include no chest pain, headaches, palpitations or shortness of breath. Past treatments include ACE inhibitors and calcium channel blockers. The current treatment provides significant improvement. There is no history of kidney disease, CAD/MI or CVA.    Lab Results  Component Value Date   NA 140 03/31/2022   K 3.6 03/31/2022   CO2 24 03/31/2022   GLUCOSE 109 (H) 03/31/2022   BUN 12 03/31/2022   CREATININE 0.86 03/31/2022   CALCIUM 9.4 03/31/2022   EGFR 102 03/31/2022   GFRNONAA 101 01/29/2019   Lab Results  Component Value Date   CHOL 211 (H) 03/31/2022   HDL 70 03/31/2022   LDLCALC 130 (H) 03/31/2022   TRIG 59 03/31/2022   CHOLHDL 3.0 03/31/2022   Lab Results  Component Value Date   TSH 0.711 08/29/2015   Lab Results  Component Value Date   HGBA1C 5.6 08/29/2015   Lab Results  Component Value Date   WBC 4.3 03/31/2022   HGB 15.3 03/31/2022   HCT 44.6 03/31/2022   MCV 85 03/31/2022   PLT 266 03/31/2022   Lab Results  Component Value Date   ALT 20 03/31/2022   AST 14 03/31/2022   ALKPHOS 117 03/31/2022   BILITOT 0.3 03/31/2022   No results found for: "25OHVITD2", "25OHVITD3", "VD25OH"   Review of Systems  Constitutional:  Negative for chills, fatigue and unexpected weight change.  HENT:  Negative for trouble swallowing.   Respiratory:  Negative for cough, chest tightness, shortness of breath and wheezing.   Cardiovascular:  Negative for chest pain and palpitations.  Musculoskeletal:  Positive for arthralgias (right shoulder pain).  Neurological:  Negative for  dizziness and headaches.  Psychiatric/Behavioral:  Negative for dysphoric mood and sleep disturbance. The patient is not nervous/anxious.     Patient Active Problem List   Diagnosis Date Noted   Mixed hyperlipidemia 04/01/2022   Benign prostatic hyperplasia 12/30/2021   HNP (herniated nucleus pulposus), lumbar 01/25/2018   Pain of left hip joint 01/25/2018   Hx of adenomatous colonic polyps 01/19/2017   Essential (primary) hypertension 08/24/2015   Irritable bowel syndrome with diarrhea 08/24/2015   Migraine without aura and responsive to treatment 08/24/2015   Restless leg 08/24/2015    No Known Allergies  Past Surgical History:  Procedure Laterality Date   COLONOSCOPY  2013   tubular adenoma   COLONOSCOPY  03/09/2017   tubular adenoma   ROTATOR CUFF REPAIR     TOTAL HIP ARTHROPLASTY Left 01/20/2022    Social History   Tobacco Use   Smoking status: Never   Smokeless tobacco: Never  Substance Use Topics   Alcohol use: Yes    Alcohol/week: 4.0 standard drinks of alcohol    Types: 4 Standard drinks or equivalent per week   Drug use: No     Medication list has been reviewed and updated.  Current Meds  Medication Sig   aspirin-acetaminophen-caffeine (EXCEDRIN MIGRAINE) 250-250-65 MG tablet Take 2 tablets by mouth every 6 (six) hours as needed for headache.   atorvastatin (LIPITOR) 10 MG  tablet Take 1 tablet (10 mg total) by mouth daily.   cyclobenzaprine (FLEXERIL) 10 MG tablet Take 1 tablet (10 mg total) by mouth 3 (three) times daily as needed for muscle spasms.   SUMAtriptan (IMITREX) 100 MG tablet Take 1 tablet (100 mg total) by mouth every 2 (two) hours as needed for migraine. May repeat in 2 hours if headache persists or recurs.   tamsulosin (FLOMAX) 0.4 MG CAPS capsule Take 1 capsule (0.4 mg total) by mouth daily.   [DISCONTINUED] amLODipine (NORVASC) 10 MG tablet Take 1 tablet (10 mg total) by mouth daily.   [DISCONTINUED] benazepril (LOTENSIN) 40 MG tablet  TAKE 1 TABLET BY MOUTH ONCE DAILY . APPOINTMENT REQUIRED FOR FUTURE REFILLS   [DISCONTINUED] gabapentin (NEURONTIN) 300 MG capsule Take 2 capsules (600 mg total) by mouth daily.       09/13/2022    3:27 PM 03/31/2022    9:37 AM 11/13/2021    1:51 PM 08/20/2021   10:52 AM  GAD 7 : Generalized Anxiety Score  Nervous, Anxious, on Edge 0 0 0 0  Control/stop worrying 0 0 0 0  Worry too much - different things 0 0 0 0  Trouble relaxing 0 0 0 0  Restless 0 0 0 0  Easily annoyed or irritable 0 0 0 0  Afraid - awful might happen 0 0 0 0  Total GAD 7 Score 0 0 0 0  Anxiety Difficulty Not difficult at all   Not difficult at all       09/13/2022    3:27 PM 03/31/2022    9:36 AM 11/13/2021    1:51 PM  Depression screen PHQ 2/9  Decreased Interest 0 0 0  Down, Depressed, Hopeless 0 0 0  PHQ - 2 Score 0 0 0  Altered sleeping _0 Tired, decreased energy 0 1 0  Change in appetite 0 0 0  Feeling bad or failure about yourself  0 0 0  Trouble concentrating 0 0 0  Moving slowly or fidgety/restless 0 0 0  Suicidal thoughts 0 0 0  PHQ-9 Score _1 Difficult doing work/chores Not difficult at all Not difficult at all Not difficult at all    BP Readings from Last 3 Encounters:  09/13/22 132/82  03/31/22 134/86  11/13/21 132/80    Physical Exam Vitals and nursing note reviewed.  Constitutional:      General: He is not in acute distress.    Appearance: Normal appearance. He is well-developed.  HENT:     Head: Normocephalic and atraumatic.  Eyes:     Pupils: Pupils are equal, round, and reactive to light.  Neck:     Vascular: No carotid bruit.  Cardiovascular:     Rate and Rhythm: Normal rate and regular rhythm.     Pulses: Normal pulses.     Heart sounds: No murmur heard. Pulmonary:     Effort: Pulmonary effort is normal. No respiratory distress.     Breath sounds: No wheezing or rhonchi.  Musculoskeletal:     Cervical back: Normal range of motion.     Right lower leg: No  edema.     Left lower leg: No edema.  Skin:    General: Skin is warm and dry.     Capillary Refill: Capillary refill takes less than 2 seconds.     Findings: No rash.  Neurological:     General: No focal deficit present.     Mental Status: He is  alert and oriented to person, place, and time.  Psychiatric:        Mood and Affect: Mood normal.        Behavior: Behavior normal.     Wt Readings from Last 3 Encounters:  09/13/22 204 lb (92.5 kg)  03/31/22 206 lb (93.4 kg)  11/13/21 204 lb (92.5 kg)    BP 132/82 (BP Location: Left Arm, Cuff Size: Large)   Pulse 87   Ht _0  (1.575 m)   Wt 204 lb (92.5 kg)   SpO2 95%   BMI 37.31 kg/m   Assessment and Plan: 1. Pre-op exam Normal exam with low risk of complications from surgery. Will forward EKG and labs when returned. - EKG 12-Lead - SR @ 74, non specific T wave changes - no prior to compare - CBC with Differential/Platelet - Basic metabolic panel  2. Essential (primary) hypertension Clinically stable exam with well controlled BP. Tolerating medications without side effects at this time. Pt to continue current regimen and low sodium diet;  - amLODipine (NORVASC) 10 MG tablet; Take 1 tablet (10 mg total) by mouth daily.  Dispense: 90 tablet; Refill: 3 - benazepril (LOTENSIN) 40 MG tablet; TAKE 1 TABLET BY MOUTH ONCE DAILY . APPOINTMENT REQUIRED FOR FUTURE REFILLS  Dispense: 90 tablet; Refill: 3  3. Restless leg syndrome Controlled; continue gabapentin - gabapentin (NEURONTIN) 300 MG capsule; Take 2 capsules (600 mg total) by mouth daily.  Dispense: 180 capsule; Refill: 3   Partially dictated using Editor, commissioning. Any errors are unintentional.  Halina Maidens, MD [facility]  09/13/2022

## 2022-09-14 ENCOUNTER — Telehealth: Payer: Self-pay | Admitting: Internal Medicine

## 2022-09-14 LAB — CBC WITH DIFFERENTIAL/PLATELET
Basophils Absolute: 0 10*3/uL (ref 0.0–0.2)
Basos: 1 %
EOS (ABSOLUTE): 0.1 10*3/uL (ref 0.0–0.4)
Eos: 3 %
Hematocrit: 42.5 % (ref 37.5–51.0)
Hemoglobin: 14.3 g/dL (ref 13.0–17.7)
Immature Grans (Abs): 0 10*3/uL (ref 0.0–0.1)
Immature Granulocytes: 0 %
Lymphocytes Absolute: 2.3 10*3/uL (ref 0.7–3.1)
Lymphs: 46 %
MCH: 29.5 pg (ref 26.6–33.0)
MCHC: 33.6 g/dL (ref 31.5–35.7)
MCV: 88 fL (ref 79–97)
Monocytes Absolute: 0.6 10*3/uL (ref 0.1–0.9)
Monocytes: 13 %
Neutrophils Absolute: 1.8 10*3/uL (ref 1.4–7.0)
Neutrophils: 37 %
Platelets: 254 10*3/uL (ref 150–450)
RBC: 4.84 x10E6/uL (ref 4.14–5.80)
RDW: 13.2 % (ref 11.6–15.4)
WBC: 4.8 10*3/uL (ref 3.4–10.8)

## 2022-09-14 LAB — BASIC METABOLIC PANEL
BUN/Creatinine Ratio: 16 (ref 9–20)
BUN: 13 mg/dL (ref 6–24)
CO2: 26 mmol/L (ref 20–29)
Calcium: 9.4 mg/dL (ref 8.7–10.2)
Chloride: 102 mmol/L (ref 96–106)
Creatinine, Ser: 0.81 mg/dL (ref 0.76–1.27)
Glucose: 99 mg/dL (ref 70–99)
Potassium: 4.5 mmol/L (ref 3.5–5.2)
Sodium: 141 mmol/L (ref 134–144)
eGFR: 103 mL/min/{1.73_m2} (ref >59)

## 2022-09-14 NOTE — Telephone Encounter (Signed)
Paige with [facility] is calling in to request pt's labs and imaging from yesterdays visit. Pt has a surgery scheduled with them so they are needed.    Please fax to: Attn Dr. Karmen Stabs team   Fax: [phone]

## 2022-09-14 NOTE — Telephone Encounter (Signed)
Labs faxed via Mychart.   KP

## 2022-09-21 NOTE — Telephone Encounter (Signed)
Requesting the most recent EKG   Please fax to: Attn Dr. Karmen Stabs team    Fax: [phone]

## 2022-09-22 NOTE — Telephone Encounter (Signed)
Faxed EKG.  CM

## 2022-09-24 ENCOUNTER — Telehealth: Payer: Self-pay | Admitting: Internal Medicine

## 2022-09-24 NOTE — Telephone Encounter (Signed)
Faxed.   KP 

## 2022-09-24 NOTE — Telephone Encounter (Signed)
Copied from CRM [id number]. Topic: General - Other >> Sep 24, 2022 10:10 AM Franchot Heidelberg wrote: Pt called to request a fax of his EKG results to   Fax number: [phone] attn: Dr. Dewaine Conger

## 2023-01-21 ENCOUNTER — Other Ambulatory Visit: Payer: Self-pay | Admitting: Internal Medicine

## 2023-01-21 DIAGNOSIS — N401 Enlarged prostate with lower urinary tract symptoms: Secondary | ICD-10-CM

## 2023-01-21 NOTE — Telephone Encounter (Signed)
Requested Prescriptions  Pending Prescriptions Disp Refills   tamsulosin (FLOMAX) 0.4 MG CAPS capsule [Pharmacy Med Name: Tamsulosin HCl 0.4 MG Oral Capsule] 90 capsule 0    Sig: Take 1 capsule by mouth once daily     Urology: Alpha-Adrenergic Blocker Passed - 01/21/2023 11:30 AM      Passed - PSA in normal range and within 360 days    PSA  Date Value Ref Range Status  06/18/2014 0.9  Final   Prostate Specific Ag, Serum  Date Value Ref Range Status  03/31/2022 2.3 0.0 - 4.0 ng/mL Final    Comment:    Roche ECLIA methodology. According to the American Urological Association, Serum PSA should decrease and remain at undetectable levels after radical prostatectomy. The AUA defines biochemical recurrence as an initial PSA value 0.2 ng/mL or greater followed by a subsequent confirmatory PSA value 0.2 ng/mL or greater. Values obtained with different assay methods or kits cannot be used interchangeably. Results cannot be interpreted as absolute evidence of the presence or absence of malignant disease.          Passed - Last BP in normal range    BP Readings from Last 1 Encounters:  09/13/22 132/82         Passed - Valid encounter within last 12 months    Recent Outpatient Visits           4 months ago Pre-op exam   [place] at [facility], Jesse Sans, MD   9 months ago Annual physical exam   [place] at [facility], Jesse Sans, MD   1 year ago Low back strain, initial encounter   [place] at [facility], Jesse Sans, MD   1 year ago Essential (primary) hypertension   [place] [facility] [place] [facility], Jesse Sans, MD   2 years ago Annual physical exam   [place] at [facility], Jesse Sans, MD       Future Appointments             In 2 months Army Melia, Jesse Sans, MD  [place] at [facility]

## 2023-04-04 ENCOUNTER — Encounter: Payer: Self-pay | Admitting: Internal Medicine

## 2023-04-04 ENCOUNTER — Ambulatory Visit (INDEPENDENT_AMBULATORY_CARE_PROVIDER_SITE_OTHER): Payer: 59 | Admitting: Internal Medicine

## 2023-04-04 ENCOUNTER — Other Ambulatory Visit
Admission: RE | Admit: 2023-04-04 | Discharge: 2023-04-04 | Disposition: A | Discharge status: Home / Self Care | Payer: 59 | Attending: Internal Medicine | Admitting: Internal Medicine

## 2023-04-04 VITALS — BP 129/68 | HR 75 | Ht 62.0 in | Wt 203.0 lb

## 2023-04-04 DIAGNOSIS — G2581 Restless legs syndrome: Secondary | ICD-10-CM

## 2023-04-04 DIAGNOSIS — N401 Enlarged prostate with lower urinary tract symptoms: Secondary | ICD-10-CM | POA: Diagnosis not present

## 2023-04-04 DIAGNOSIS — Z86010 Personal history of colonic polyps: Secondary | ICD-10-CM

## 2023-04-04 DIAGNOSIS — I10 Essential (primary) hypertension: Secondary | ICD-10-CM | POA: Diagnosis not present

## 2023-04-04 DIAGNOSIS — S39012A Strain of muscle, fascia and tendon of lower back, initial encounter: Secondary | ICD-10-CM | POA: Diagnosis not present

## 2023-04-04 DIAGNOSIS — Z0000 Encounter for general adult medical examination without abnormal findings: Secondary | ICD-10-CM | POA: Diagnosis present

## 2023-04-04 DIAGNOSIS — R351 Nocturia: Secondary | ICD-10-CM

## 2023-04-04 DIAGNOSIS — E785 Hyperlipidemia, unspecified: Secondary | ICD-10-CM | POA: Insufficient documentation

## 2023-04-04 DIAGNOSIS — E782 Mixed hyperlipidemia: Secondary | ICD-10-CM | POA: Diagnosis not present

## 2023-04-04 LAB — POCT URINALYSIS DIPSTICK
Bilirubin, UA: NEGATIVE
Blood, UA: NEGATIVE
Glucose, UA: NEGATIVE
Ketones, UA: NEGATIVE
Leukocytes, UA: NEGATIVE
Nitrite, UA: NEGATIVE
Protein, UA: POSITIVE — AB
Spec Grav, UA: 1.025 (ref 1.010–1.025)
Urobilinogen, UA: 0.2 E.U./dL
pH, UA: 5 (ref 5.0–8.0)

## 2023-04-04 LAB — CBC WITH DIFFERENTIAL/PLATELET
Abs Immature Granulocytes: 0.02 10*3/uL (ref 0.00–0.07)
Basophils Absolute: 0 10*3/uL (ref 0.0–0.1)
Basophils Relative: 1 %
Eosinophils Absolute: 0.1 10*3/uL (ref 0.0–0.5)
Eosinophils Relative: 3 %
HCT: 41.4 % (ref 39.0–52.0)
Hemoglobin: 14.3 g/dL (ref 13.0–17.0)
Immature Granulocytes: 1 %
Lymphocytes Relative: 42 %
Lymphs Abs: 1.8 10*3/uL (ref 0.7–4.0)
MCH: 29.1 pg (ref 26.0–34.0)
MCHC: 34.5 g/dL (ref 30.0–36.0)
MCV: 84.3 fL (ref 80.0–100.0)
Monocytes Absolute: 0.4 10*3/uL (ref 0.1–1.0)
Monocytes Relative: 10 %
Neutro Abs: 1.9 10*3/uL (ref 1.7–7.7)
Neutrophils Relative %: 43 %
Platelets: 235 10*3/uL (ref 150–400)
RBC: 4.91 MIL/uL (ref 4.22–5.81)
RDW: 13.1 % (ref 11.5–15.5)
WBC: 4.2 10*3/uL (ref 4.0–10.5)
nRBC: 0 % (ref 0.0–0.2)

## 2023-04-04 LAB — COMPREHENSIVE METABOLIC PANEL
ALT: 36 U/L (ref 0–44)
AST: 28 U/L (ref 15–41)
Albumin: 4.1 g/dL (ref 3.5–5.0)
Alkaline Phosphatase: 83 U/L (ref 38–126)
Anion gap: 10 (ref 5–15)
BUN: 14 mg/dL (ref 6–20)
CO2: 26 mmol/L (ref 22–32)
Calcium: 9.1 mg/dL (ref 8.9–10.3)
Chloride: 102 mmol/L (ref 98–111)
Creatinine, Ser: 0.88 mg/dL (ref 0.61–1.24)
GFR, Estimated: >60 mL/min (ref >60)
Glucose, Bld: 109 mg/dL — ABNORMAL HIGH (ref 70–99)
Potassium: 4 mmol/L (ref 3.5–5.1)
Sodium: 138 mmol/L (ref 135–145)
Total Bilirubin: 0.5 mg/dL (ref 0.3–1.2)
Total Protein: 7.5 g/dL (ref 6.5–8.1)

## 2023-04-04 LAB — LIPID PANEL
Cholesterol: 175 mg/dL (ref 0–200)
HDL: 87 mg/dL (ref >40)
LDL Cholesterol: 79 mg/dL (ref 0–99)
Total CHOL/HDL Ratio: 2 RATIO
Triglycerides: 44 mg/dL (ref <150)
VLDL: 9 mg/dL (ref 0–40)

## 2023-04-04 LAB — PSA: Prostatic Specific Antigen: 2.42 ng/mL (ref 0.00–4.00)

## 2023-04-04 MED ORDER — ATORVASTATIN CALCIUM 10 MG PO TABS: 10.0000 mg | ORAL_TABLET | Freq: Every day | ORAL | 3 refills | Status: DC

## 2023-04-04 MED ORDER — CYCLOBENZAPRINE HCL 10 MG PO TABS
10.0000 mg | ORAL_TABLET | Freq: Three times a day (TID) | ORAL | 0 refills | Status: AC | PRN
Start: 2023-04-04 — End: ?

## 2023-04-04 MED ORDER — TAMSULOSIN HCL 0.4 MG PO CAPS: 0.4000 mg | ORAL_CAPSULE | Freq: Every day | ORAL | 0 refills | Status: DC

## 2023-04-04 NOTE — Assessment & Plan Note (Addendum)
Mild nocturia symptoms controlled with Flomax

## 2023-04-04 NOTE — Assessment & Plan Note (Signed)
Stable exam with well controlled BP.  Currently taking amlodipine and benazepril. Tolerating medications without concerns or side effects. Will continue to recommend low sodium diet and current regimen.

## 2023-04-04 NOTE — Assessment & Plan Note (Signed)
Recent colonoscopy - 2 tubular adenomas Repeat 08/2027

## 2023-04-04 NOTE — Assessment & Plan Note (Addendum)
Symptoms mostly resolved.  Not taking gabapentin regularly.

## 2023-04-04 NOTE — Assessment & Plan Note (Signed)
Tolerating statin medications.  No side effects noted. LDL is  Lab Results  Component Value Date   LDLCALC 130 (H) 03/31/2022

## 2023-04-04 NOTE — Progress Notes (Signed)
Date:  04/04/2023   Name:  Earl Garcia   DOB:  09-24-1966   MRN:  [id number]   Chief Complaint: Annual Exam Earl Garcia is a 57 y.o. male who presents today for his Complete Annual Exam. He feels well. He reports exercising - basketball. He reports he is sleeping fairly well.   Colonoscopy: 08/2022 repeat 5 yrs  Immunization History  Administered Date(s) Administered   Influenza,inj,Quad PF,6+ Mos 08/03/2018, 09/13/2022   Influenza-Unspecified 08/27/2021   Moderna Sars-Covid-2 Vaccination 02/11/2020, 03/08/2020, 10/25/2020, 09/10/2021   Tdap 01/07/2015   Health Maintenance Due  Topic Date Due   HIV Screening  Never done   Zoster Vaccines- Shingrix (1 of 2) Never done   COVID-19 Vaccine (5 - 2023-24 season) 06/25/2022    Lab Results  Component Value Date   PSA1 2.3 03/31/2022   PSA1 1.6 01/20/2021   PSA1 3.3 01/29/2019   PSA 0.9 06/18/2014    Hypertension This is a chronic problem. The problem is controlled. Pertinent negatives include no chest pain, headaches, palpitations or shortness of breath. Past treatments include calcium channel blockers and ACE inhibitors. The current treatment provides significant improvement.  Hyperlipidemia Pertinent negatives include no chest pain, myalgias or shortness of breath. Current antihyperlipidemic treatment includes statins. The current treatment provides significant improvement of lipids.  Back Pain This is a new problem. The current episode started 1 to 4 weeks ago. The problem occurs daily. The problem has been gradually improving since onset. The pain is present in the lumbar spine. The quality of the pain is described as aching. The pain does not radiate. The pain is mild. The pain is Worse during the day. The symptoms are aggravated by standing and twisting. Pertinent negatives include no abdominal pain, chest pain, dysuria or headaches. He has tried analgesics and heat for the symptoms. The treatment provided mild relief.     Lab Results  Component Value Date   NA 138 04/04/2023   K 4.0 04/04/2023   CO2 26 04/04/2023   GLUCOSE 109 (H) 04/04/2023   BUN 14 04/04/2023   CREATININE 0.88 04/04/2023   CALCIUM 9.1 04/04/2023   EGFR 103 09/13/2022   GFRNONAA >60 04/04/2023   Lab Results  Component Value Date   CHOL 211 (H) 03/31/2022   HDL 70 03/31/2022   LDLCALC 130 (H) 03/31/2022   TRIG 59 03/31/2022   CHOLHDL 3.0 03/31/2022   Lab Results  Component Value Date   TSH 0.711 08/29/2015   Lab Results  Component Value Date   HGBA1C 5.6 08/29/2015   Lab Results  Component Value Date   WBC 4.2 04/04/2023   HGB 14.3 04/04/2023   HCT 41.4 04/04/2023   MCV 84.3 04/04/2023   PLT 235 04/04/2023   Lab Results  Component Value Date   ALT 36 04/04/2023   AST 28 04/04/2023   ALKPHOS 83 04/04/2023   BILITOT 0.5 04/04/2023   No results found for: "25OHVITD2", "25OHVITD3", "VD25OH"   Review of Systems  Constitutional:  Negative for appetite change, chills, diaphoresis, fatigue and unexpected weight change.  HENT:  Negative for hearing loss, tinnitus, trouble swallowing and voice change.   Eyes:  Negative for visual disturbance.  Respiratory:  Negative for choking, shortness of breath and wheezing.   Cardiovascular:  Negative for chest pain, palpitations and leg swelling.  Gastrointestinal:  Negative for abdominal pain, blood in stool, constipation and diarrhea.  Genitourinary:  Negative for difficulty urinating, dysuria and frequency.  Musculoskeletal:  Positive for back pain.  Negative for arthralgias and myalgias.  Skin:  Negative for color change and rash.  Neurological:  Negative for dizziness, syncope and headaches.  Hematological:  Negative for adenopathy.  Psychiatric/Behavioral:  Negative for dysphoric mood and sleep disturbance. The patient is not nervous/anxious.     Patient Active Problem List   Diagnosis Date Noted   Mixed hyperlipidemia 04/01/2022   BPH associated with nocturia  12/30/2021   HNP (herniated nucleus pulposus), lumbar 01/25/2018   Pain of left hip joint 01/25/2018   Hx of adenomatous colonic polyps 01/19/2017   Essential (primary) hypertension 08/24/2015   Irritable bowel syndrome with diarrhea 08/24/2015   Migraine without aura and responsive to treatment 08/24/2015   Restless leg 08/24/2015    No Known Allergies  Past Surgical History:  Procedure Laterality Date   COLONOSCOPY  2013   tubular adenoma   COLONOSCOPY  03/09/2017   tubular adenoma   ROTATOR CUFF REPAIR     TOTAL HIP ARTHROPLASTY Left 01/20/2022    Social History   Tobacco Use   Smoking status: Never   Smokeless tobacco: Never  Substance Use Topics   Alcohol use: Yes    Alcohol/week: 4.0 standard drinks of alcohol    Types: 4 Standard drinks or equivalent per week   Drug use: No     Medication list has been reviewed and updated.  Current Meds  Medication Sig   amLODipine (NORVASC) 10 MG tablet Take 1 tablet (10 mg total) by mouth daily.   aspirin-acetaminophen-caffeine (EXCEDRIN MIGRAINE) 250-250-65 MG tablet Take 2 tablets by mouth every 6 (six) hours as needed for headache.   benazepril (LOTENSIN) 40 MG tablet TAKE 1 TABLET BY MOUTH ONCE DAILY . APPOINTMENT REQUIRED FOR FUTURE REFILLS   SUMAtriptan (IMITREX) 100 MG tablet Take 1 tablet (100 mg total) by mouth every 2 (two) hours as needed for migraine. May repeat in 2 hours if headache persists or recurs.   [DISCONTINUED] atorvastatin (LIPITOR) 10 MG tablet Take 1 tablet (10 mg total) by mouth daily.   [DISCONTINUED] cyclobenzaprine (FLEXERIL) 10 MG tablet Take 1 tablet (10 mg total) by mouth 3 (three) times daily as needed for muscle spasms.   [DISCONTINUED] gabapentin (NEURONTIN) 300 MG capsule Take 2 capsules (600 mg total) by mouth daily.   [DISCONTINUED] tamsulosin (FLOMAX) 0.4 MG CAPS capsule Take 1 capsule by mouth once daily       04/04/2023    9:51 AM 09/13/2022    3:27 PM 03/31/2022    9:37 AM 11/13/2021     1:51 PM  GAD 7 : Generalized Anxiety Score  Nervous, Anxious, on Edge 0 0 0 0  Control/stop worrying 0 0 0 0  Worry too much - different things 0 0 0 0  Trouble relaxing 0 0 0 0  Restless 0 0 0 0  Easily annoyed or irritable 0 0 0 0  Afraid - awful might happen 0 0 0 0  Total GAD 7 Score 0 0 0 0  Anxiety Difficulty Not difficult at all Not difficult at all         04/04/2023    9:51 AM 09/13/2022    3:27 PM 03/31/2022    9:36 AM  Depression screen PHQ 2/9  Decreased Interest 0 0 0  Down, Depressed, Hopeless 0 0 0  PHQ - 2 Score 0 0 0  Altered sleeping 1 2 3   Tired, decreased energy 0 0 1  Change in appetite 0 0 0  Feeling bad or failure about yourself  0 0 0  Trouble concentrating 0 0 0  Moving slowly or fidgety/restless 0 0 0  Suicidal thoughts 0 0 0  PHQ-9 Score 1 2 4   Difficult doing work/chores Not difficult at all Not difficult at all Not difficult at all    BP Readings from Last 3 Encounters:  04/04/23 129/68  09/13/22 132/82  03/31/22 134/86    Physical Exam Vitals and nursing note reviewed.  Constitutional:      Appearance: Normal appearance. He is well-developed.  HENT:     Head: Normocephalic.     Right Ear: Tympanic membrane, ear canal and external ear normal.     Left Ear: Tympanic membrane, ear canal and external ear normal.     Nose: Nose normal.  Eyes:     Conjunctiva/sclera: Conjunctivae normal.     Pupils: Pupils are equal, round, and reactive to light.  Neck:     Thyroid: No thyromegaly.     Vascular: No carotid bruit.  Cardiovascular:     Rate and Rhythm: Normal rate and regular rhythm.     Heart sounds: Normal heart sounds.  Pulmonary:     Effort: Pulmonary effort is normal.     Breath sounds: Normal breath sounds. No wheezing.  Chest:  Breasts:    Right: No mass.     Left: No mass.  Abdominal:     General: Bowel sounds are normal.     Palpations: Abdomen is soft.     Tenderness: There is no abdominal tenderness.   Musculoskeletal:        General: Normal range of motion.     Cervical back: Normal range of motion and neck supple.     Lumbar back: No spasms or tenderness.     Right lower leg: No edema.     Left lower leg: No edema.  Lymphadenopathy:     Cervical: No cervical adenopathy.  Skin:    General: Skin is warm and dry.     Capillary Refill: Capillary refill takes less than 2 seconds.  Neurological:     General: No focal deficit present.     Mental Status: He is alert and oriented to person, place, and time.     Sensory: Sensation is intact.     Motor: Motor function is intact.     Gait: Gait is intact.     Deep Tendon Reflexes: Reflexes are normal and symmetric.     Reflex Scores:      Patellar reflexes are 2+ on the right side and 2+ on the left side. Psychiatric:        Attention and Perception: Attention normal.        Mood and Affect: Mood normal.        Thought Content: Thought content normal.     Wt Readings from Last 3 Encounters:  04/04/23 203 lb (92.1 kg)  09/13/22 204 lb (92.5 kg)  03/31/22 206 lb (93.4 kg)    BP 129/68 (BP Location: Left Arm, Cuff Size: Large)   Pulse 75   Ht 5\' 2"  (1.575 m)   Wt 203 lb (92.1 kg)   SpO2 98%   BMI 37.13 kg/m   Assessment and Plan:  Problem List Items Addressed This Visit     Restless leg (Chronic)    Symptoms mostly resolved.  Not taking gabapentin regularly.      Mixed hyperlipidemia (Chronic)    Tolerating statin medications.  No side effects noted. LDL is  Lab Results  Component Value Date  LDLCALC 130 (H) 03/31/2022        Relevant Medications   atorvastatin (LIPITOR) 10 MG tablet   Other Relevant Orders   Lipid panel   Hx of adenomatous colonic polyps    Recent colonoscopy - 2 tubular adenomas Repeat 08/2027      Essential (primary) hypertension (Chronic)    Stable exam with well controlled BP.  Currently taking amlodipine and benazepril. Tolerating medications without concerns or side effects. Will  continue to recommend low sodium diet and current regimen.       Relevant Medications   atorvastatin (LIPITOR) 10 MG tablet   Other Relevant Orders   POCT urinalysis dipstick (Completed)   CBC with Differential/Platelet (Completed)   Comprehensive metabolic panel (Completed)   BPH associated with nocturia    Mild nocturia symptoms controlled with Flomax      Relevant Medications   tamsulosin (FLOMAX) 0.4 MG CAPS capsule   Other Relevant Orders   PSA   Other Visit Diagnoses     Annual physical exam    -  Primary   up to date on screenings rec Shingrix vaccine through his local pharmacy   Low back strain, initial encounter       continue tylenol and heat add flexeril qhs   Relevant Medications   cyclobenzaprine (FLEXERIL) 10 MG tablet       Return in about 6 months (around 10/04/2023) for HTN.   Partially dictated using Dragon software, any errors are not intentional.  Reubin Milan, MD [facility] [place], [place]

## 2023-07-22 ENCOUNTER — Other Ambulatory Visit: Payer: Self-pay | Admitting: Internal Medicine

## 2023-07-22 DIAGNOSIS — N401 Enlarged prostate with lower urinary tract symptoms: Secondary | ICD-10-CM

## 2023-07-22 NOTE — Telephone Encounter (Signed)
Requested medication (s) are due for refill today:   Yes  Requested medication (s) are on the active medication list:   Yes  Future visit scheduled:   Yes 11/26   Last ordered: 04/04/2023 #90, 0 refills  Unable to refill because PSA is due per protocol   Requested Prescriptions  Pending Prescriptions Disp Refills   tamsulosin (FLOMAX) 0.4 MG CAPS capsule [Pharmacy Med Name: Tamsulosin HCl 0.4 MG Oral Capsule] 90 capsule 0    Sig: Take 1 capsule by mouth once daily     Urology: Alpha-Adrenergic Blocker Failed - 07/22/2023  6:50 AM      Failed - PSA in normal range and within 360 days    PSA  Date Value Ref Range Status  06/18/2014 0.9  Final   Prostate Specific Ag, Serum  Date Value Ref Range Status  03/31/2022 2.3 0.0 - 4.0 ng/mL Final    Comment:    Roche ECLIA methodology. According to the American Urological Association, Serum PSA should decrease and remain at undetectable levels after radical prostatectomy. The AUA defines biochemical recurrence as an initial PSA value 0.2 ng/mL or greater followed by a subsequent confirmatory PSA value 0.2 ng/mL or greater. Values obtained with different assay methods or kits cannot be used interchangeably. Results cannot be interpreted as absolute evidence of the presence or absence of malignant disease.          Passed - Last BP in normal range    BP Readings from Last 1 Encounters:  04/04/23 129/68         Passed - Valid encounter within last 12 months    Recent Outpatient Visits           3 months ago Annual physical exam   [place] [facility] at [facility], Nyoka Cowden, MD   10 months ago Pre-op exam   [facility] at [facility], Nyoka Cowden, MD   1 year ago Annual physical exam   [facility] Primary Care & Sports Medicine at [facility], Nyoka Cowden, MD   1 year ago Low back strain, initial encounter   [facility] Care & Sports  Medicine at [facility], Nyoka Cowden, MD   1 year ago Essential (primary) hypertension   [place] [facility] at [facility], Nyoka Cowden, MD       Future Appointments             In 2 months Judithann Graves, Nyoka Cowden, MD [facility] Primary Care & Sports Medicine at [facility], [facility]   In 8 months Judithann Graves, Nyoka Cowden, MD [facility] Health Primary Care & Sports Medicine at [facility], [facility]

## 2023-07-22 NOTE — Telephone Encounter (Signed)
Refill

## 2023-09-20 ENCOUNTER — Ambulatory Visit: Payer: 59 | Admitting: Internal Medicine

## 2023-09-23 ENCOUNTER — Other Ambulatory Visit: Payer: Self-pay | Admitting: Internal Medicine

## 2023-09-23 DIAGNOSIS — I10 Essential (primary) hypertension: Secondary | ICD-10-CM

## 2023-09-27 NOTE — Telephone Encounter (Signed)
Requested Prescriptions  Pending Prescriptions Disp Refills   amLODipine (NORVASC) 10 MG tablet [Pharmacy Med Name: amLODIPine Besylate 10 MG Oral Tablet] 30 tablet 0    Sig: Take 1 tablet by mouth once daily     Cardiovascular: Calcium Channel Blockers 2 Passed - 09/23/2023  6:52 AM      Passed - Last BP in normal range    BP Readings from Last 1 Encounters:  04/04/23 129/68         Passed - Last Heart Rate in normal range    Pulse Readings from Last 1 Encounters:  04/04/23 75         Passed - Valid encounter within last 6 months    Recent Outpatient Visits           5 months ago Annual physical exam   [place] [facility] at [facility], Nyoka Cowden, MD   1 year ago Pre-op exam   [place] [facility] at [facility], Nyoka Cowden, MD   1 year ago Annual physical exam   [facility] Primary [facility] & Sports Medicine at [facility], Nyoka Cowden, MD   1 year ago Low back strain, initial encounter   [facility] & Sports Medicine at [facility], Nyoka Cowden, MD   2 years ago Essential (primary) hypertension   [place] [facility] at [facility], Nyoka Cowden, MD       Future Appointments             In 3 days Reubin Milan, MD [facility] Health Primary Care & Sports Medicine at [facility], [facility]   In 6 months Reubin Milan, MD [facility] Primary Care & Sports Medicine at [facility], [facility]             benazepril (LOTENSIN) 40 MG tablet [Pharmacy Med Name: Benazepril HCl 40 MG Oral Tablet] 30 tablet 0    Sig: TAKE 1 TABLET BY MOUTH ONCE DAILY APPOINTMENT  REQUIRED  FOR  FUTURE  REFILLS     Cardiovascular:  ACE Inhibitors Passed - 09/23/2023  6:52 AM      Passed - Cr in normal range and within 180 days    Creatinine, Ser  Date Value Ref Range Status  04/04/2023 0.88 0.61 - 1.24 mg/dL Final         Passed - K in normal range and within  180 days    Potassium  Date Value Ref Range Status  04/04/2023 4.0 3.5 - 5.1 mmol/L Final         Passed - Patient is not pregnant      Passed - Last BP in normal range    BP Readings from Last 1 Encounters:  04/04/23 129/68         Passed - Valid encounter within last 6 months    Recent Outpatient Visits           5 months ago Annual physical exam   [place] [facility] at [facility], Nyoka Cowden, MD   1 year ago Pre-op exam   [facility] Medicine at [facility], Nyoka Cowden, MD   1 year ago Annual physical exam   [facility] & Sports Medicine at [facility], Nyoka Cowden, MD   1 year ago Low back strain, initial encounter   [facility] at [facility], Nyoka Cowden, MD   2 years ago Essential (primary) hypertension   [facility] Primary Care & Sports Medicine at [facility], Nyoka Cowden, MD       Future Appointments             In 3 days Judithann Graves Nyoka Cowden, MD [facility] [facility] Primary Care & Sports Medicine at [facility], [facility]   In 6 months Judithann Graves, Nyoka Cowden, MD [facility] Health Primary Care & Sports Medicine at [facility], [facility]

## 2023-09-30 ENCOUNTER — Ambulatory Visit (INDEPENDENT_AMBULATORY_CARE_PROVIDER_SITE_OTHER): Payer: 59 | Admitting: Internal Medicine

## 2023-09-30 ENCOUNTER — Encounter: Payer: Self-pay | Admitting: Internal Medicine

## 2023-09-30 VITALS — BP 112/78 | HR 85 | Ht 62.0 in | Wt 208.0 lb

## 2023-09-30 DIAGNOSIS — R351 Nocturia: Secondary | ICD-10-CM

## 2023-09-30 DIAGNOSIS — N401 Enlarged prostate with lower urinary tract symptoms: Secondary | ICD-10-CM

## 2023-09-30 DIAGNOSIS — Z23 Encounter for immunization: Secondary | ICD-10-CM

## 2023-09-30 DIAGNOSIS — I10 Essential (primary) hypertension: Secondary | ICD-10-CM

## 2023-09-30 MED ORDER — BENAZEPRIL HCL 40 MG PO TABS: ORAL_TABLET | ORAL | 12 refills | Status: DC

## 2023-09-30 MED ORDER — TAMSULOSIN HCL 0.4 MG PO CAPS: 0.4000 mg | ORAL_CAPSULE | Freq: Every day | ORAL | 12 refills | Status: DC

## 2023-09-30 MED ORDER — AMLODIPINE BESYLATE 10 MG PO TABS: 10.0000 mg | ORAL_TABLET | Freq: Every day | ORAL | 12 refills | Status: DC

## 2023-09-30 NOTE — Assessment & Plan Note (Signed)
Controlled BP with normal exam. Current regimen is amlodipine and benazepril. Will continue same medications; encourage continued reduced sodium diet.

## 2023-09-30 NOTE — Progress Notes (Signed)
Date:  09/30/2023   Name:  Earl Garcia   DOB:  Mar 17, 1966   MRN:  [id number]   Chief Complaint: Hypertension  Hypertension This is a chronic problem. The problem is controlled. Pertinent negatives include no chest pain, headaches, palpitations or shortness of breath. Past treatments include calcium channel blockers and ACE inhibitors. The current treatment provides significant improvement. There is no history of kidney disease, CAD/MI or CVA.    Review of Systems  Constitutional:  Negative for fatigue and unexpected weight change.  HENT:  Negative for nosebleeds.   Eyes:  Negative for visual disturbance.  Respiratory:  Negative for cough, chest tightness, shortness of breath and wheezing.   Cardiovascular:  Negative for chest pain, palpitations and leg swelling.  Gastrointestinal:  Negative for abdominal pain, constipation and diarrhea.  Neurological:  Negative for dizziness, weakness, light-headedness and headaches.  Psychiatric/Behavioral:  Negative for dysphoric mood and sleep disturbance.      Lab Results  Component Value Date   NA 138 04/04/2023   K 4.0 04/04/2023   CO2 26 04/04/2023   GLUCOSE 109 (H) 04/04/2023   BUN 14 04/04/2023   CREATININE 0.88 04/04/2023   CALCIUM 9.1 04/04/2023   EGFR 103 09/13/2022   GFRNONAA >60 04/04/2023   Lab Results  Component Value Date   CHOL 175 04/04/2023   HDL 87 04/04/2023   LDLCALC 79 04/04/2023   TRIG 44 04/04/2023   CHOLHDL 2.0 04/04/2023   Lab Results  Component Value Date   TSH 0.711 08/29/2015   Lab Results  Component Value Date   HGBA1C 5.6 08/29/2015   Lab Results  Component Value Date   WBC 4.2 04/04/2023   HGB 14.3 04/04/2023   HCT 41.4 04/04/2023   MCV 84.3 04/04/2023   PLT 235 04/04/2023   Lab Results  Component Value Date   ALT 36 04/04/2023   AST 28 04/04/2023   ALKPHOS 83 04/04/2023   BILITOT 0.5 04/04/2023   No results found for: "25OHVITD2", "25OHVITD3", "VD25OH"   Patient Active Problem  List   Diagnosis Date Noted   Mixed hyperlipidemia 04/01/2022   BPH associated with nocturia 12/30/2021   HNP (herniated nucleus pulposus), lumbar 01/25/2018   Pain of left hip joint 01/25/2018   Hx of adenomatous colonic polyps 01/19/2017   Essential (primary) hypertension 08/24/2015   Irritable bowel syndrome with diarrhea 08/24/2015   Migraine without aura and responsive to treatment 08/24/2015   Restless leg 08/24/2015    No Known Allergies  Past Surgical History:  Procedure Laterality Date   COLONOSCOPY  2013   tubular adenoma   COLONOSCOPY  03/09/2017   tubular adenoma   ROTATOR CUFF REPAIR     TOTAL HIP ARTHROPLASTY Left 01/20/2022    Social History   Tobacco Use   Smoking status: Never   Smokeless tobacco: Never  Substance Use Topics   Alcohol use: Yes    Alcohol/week: 4.0 standard drinks of alcohol    Types: 4 Standard drinks or equivalent per week   Drug use: No     Medication list has been reviewed and updated.  Current Meds  Medication Sig   aspirin-acetaminophen-caffeine (EXCEDRIN MIGRAINE) 250-250-65 MG tablet Take 2 tablets by mouth every 6 (six) hours as needed for headache.   atorvastatin (LIPITOR) 10 MG tablet Take 1 tablet (10 mg total) by mouth daily.   cyclobenzaprine (FLEXERIL) 10 MG tablet Take 1 tablet (10 mg total) by mouth 3 (three) times daily as needed for muscle spasms.  SUMAtriptan (IMITREX) 100 MG tablet Take 1 tablet (100 mg total) by mouth every 2 (two) hours as needed for migraine. May repeat in 2 hours if headache persists or recurs.   [DISCONTINUED] amLODipine (NORVASC) 10 MG tablet Take 1 tablet by mouth once daily   [DISCONTINUED] benazepril (LOTENSIN) 40 MG tablet TAKE 1 TABLET BY MOUTH ONCE DAILY APPOINTMENT  REQUIRED  FOR  FUTURE  REFILLS   [DISCONTINUED] tamsulosin (FLOMAX) 0.4 MG CAPS capsule Take 1 capsule by mouth once daily       09/30/2023    1:30 PM 04/04/2023    9:51 AM 09/13/2022    3:27 PM 03/31/2022    9:37 AM   GAD 7 : Generalized Anxiety Score  Nervous, Anxious, on Edge 0 0 0 0  Control/stop worrying 0 0 0 0  Worry too much - different things 0 0 0 0  Trouble relaxing 0 0 0 0  Restless 0 0 0 0  Easily annoyed or irritable 0 0 0 0  Afraid - awful might happen 0 0 0 0  Total GAD 7 Score 0 0 0 0  Anxiety Difficulty Not difficult at all Not difficult at all Not difficult at all        09/30/2023    1:30 PM 04/04/2023    9:51 AM 09/13/2022    3:27 PM  Depression screen PHQ 2/9  Decreased Interest 0 0 0  Down, Depressed, Hopeless 0 0 0  PHQ - 2 Score 0 0 0  Altered sleeping 1 1 2   Tired, decreased energy 0 0 0  Change in appetite 0 0 0  Feeling bad or failure about yourself  0 0 0  Trouble concentrating 0 0 0  Moving slowly or fidgety/restless 0 0 0  Suicidal thoughts 0 0 0  PHQ-9 Score 1 1 2   Difficult doing work/chores Not difficult at all Not difficult at all Not difficult at all    BP Readings from Last 3 Encounters:  09/30/23 112/78  04/04/23 129/68  09/13/22 132/82    Physical Exam Vitals and nursing note reviewed.  Constitutional:      General: He is not in acute distress.    Appearance: He is well-developed.  HENT:     Head: Normocephalic and atraumatic.  Pulmonary:     Effort: Pulmonary effort is normal. No respiratory distress.  Skin:    General: Skin is warm and dry.     Findings: No rash.  Neurological:     Mental Status: He is alert and oriented to person, place, and time.  Psychiatric:        Mood and Affect: Mood normal.        Behavior: Behavior normal.     Wt Readings from Last 3 Encounters:  09/30/23 208 lb (94.3 kg)  04/04/23 203 lb (92.1 kg)  09/13/22 204 lb (92.5 kg)    BP 112/78   Pulse 85   Ht 5\' 2"  (1.575 m)   Wt 208 lb (94.3 kg)   SpO2 98%   BMI 38.04 kg/m   Assessment and Plan:  Problem List Items Addressed This Visit       Unprioritized   Essential (primary) hypertension - Primary (Chronic)    Controlled BP with normal  exam. Current regimen is amlodipine and benazepril. Will continue same medications; encourage continued reduced sodium diet.       Relevant Medications   amLODipine (NORVASC) 10 MG tablet   benazepril (LOTENSIN) 40 MG tablet   BPH  associated with nocturia   Relevant Medications   tamsulosin (FLOMAX) 0.4 MG CAPS capsule   Other Visit Diagnoses     Need for influenza vaccination       Relevant Orders   Flu vaccine trivalent PF, 6mos and older(Flulaval,Afluria,Fluarix,Fluzone)       No follow-ups on file.    Reubin Milan, MD [facility] Health Primary Care and [facility] Medicine [place]

## 2024-02-22 ENCOUNTER — Encounter: Payer: Self-pay | Admitting: Internal Medicine

## 2024-02-22 ENCOUNTER — Ambulatory Visit (INDEPENDENT_AMBULATORY_CARE_PROVIDER_SITE_OTHER): Admitting: Internal Medicine

## 2024-02-22 VITALS — BP 140/80 | HR 78 | Ht 62.0 in | Wt 204.2 lb

## 2024-02-22 DIAGNOSIS — M79661 Pain in right lower leg: Secondary | ICD-10-CM

## 2024-02-22 NOTE — Progress Notes (Signed)
 Date:  02/22/2024   Name:  Earl Garcia   DOB:  11-14-1965   MRN:  [id number]   Chief Complaint: Muscle Pain (Patient is having right calf muscle pain, tender, has been using heat and massaging it, x 1 month, played outside with son basketball and after that day has been sore )  Leg Pain  The incident occurred more than 1 week ago. The incident occurred at home. There was no injury mechanism. The pain is present in the right leg. The quality of the pain is described as aching and cramping. The pain is moderate. Pertinent negatives include no inability to bear weight, loss of sensation or muscle weakness. He reports no foreign bodies present. The symptoms are aggravated by movement and weight bearing. He has tried heat for the symptoms. The treatment provided no relief.    Review of Systems  Constitutional:  Negative for fatigue and unexpected weight change.  HENT:  Negative for nosebleeds.   Eyes:  Negative for visual disturbance.  Respiratory:  Negative for cough, chest tightness, shortness of breath and wheezing.   Cardiovascular:  Negative for chest pain, palpitations and leg swelling.  Gastrointestinal:  Negative for abdominal pain, constipation and diarrhea.  Musculoskeletal:  Positive for myalgias.  Neurological:  Negative for dizziness, weakness, light-headedness and headaches.     Lab Results  Component Value Date   NA 138 04/04/2023   K 4.0 04/04/2023   CO2 26 04/04/2023   GLUCOSE 109 (H) 04/04/2023   BUN 14 04/04/2023   CREATININE 0.88 04/04/2023   CALCIUM  9.1 04/04/2023   EGFR 103 09/13/2022   GFRNONAA >60 04/04/2023   Lab Results  Component Value Date   CHOL 175 04/04/2023   HDL 87 04/04/2023   LDLCALC 79 04/04/2023   TRIG 44 04/04/2023   CHOLHDL 2.0 04/04/2023   Lab Results  Component Value Date   TSH 0.711 08/29/2015   Lab Results  Component Value Date   HGBA1C 5.6 08/29/2015   Lab Results  Component Value Date   WBC 4.2 04/04/2023   HGB 14.3  04/04/2023   HCT 41.4 04/04/2023   MCV 84.3 04/04/2023   PLT 235 04/04/2023   Lab Results  Component Value Date   ALT 36 04/04/2023   AST 28 04/04/2023   ALKPHOS 83 04/04/2023   BILITOT 0.5 04/04/2023   No results found for: "25OHVITD2", "25OHVITD3", "VD25OH"   Patient Active Problem List   Diagnosis Date Noted   Mixed hyperlipidemia 04/01/2022   BPH associated with nocturia 12/30/2021   HNP (herniated nucleus pulposus), lumbar 01/25/2018   Pain of left hip joint 01/25/2018   Hx of adenomatous colonic polyps 01/19/2017   Essential (primary) hypertension 08/24/2015   Irritable bowel syndrome with diarrhea 08/24/2015   Migraine without aura and responsive to treatment 08/24/2015   Restless leg 08/24/2015    No Known Allergies  Past Surgical History:  Procedure Laterality Date   COLONOSCOPY  2013   tubular adenoma   COLONOSCOPY  03/09/2017   tubular adenoma   ROTATOR CUFF REPAIR     TOTAL HIP ARTHROPLASTY Left 01/20/2022    Social History   Tobacco Use   Smoking status: Never   Smokeless tobacco: Never  Substance Use Topics   Alcohol use: Yes    Alcohol/week: 4.0 standard drinks of alcohol    Types: 4 Standard drinks or equivalent per week   Drug use: No     Medication list has been reviewed and updated.  Current Meds  Medication Sig   amLODipine  (NORVASC ) 10 MG tablet Take 1 tablet (10 mg total) by mouth daily.   aspirin-acetaminophen-caffeine  (EXCEDRIN MIGRAINE) 250-250-65 MG tablet Take 2 tablets by mouth every 6 (six) hours as needed for headache.   atorvastatin  (LIPITOR) 10 MG tablet Take 1 tablet (10 mg total) by mouth daily.   benazepril  (LOTENSIN ) 40 MG tablet TAKE 1 TABLET BY MOUTH ONCE DAILY .   SUMAtriptan  (IMITREX ) 100 MG tablet Take 1 tablet (100 mg total) by mouth every 2 (two) hours as needed for migraine. May repeat in 2 hours if headache persists or recurs.   tamsulosin  (FLOMAX ) 0.4 MG CAPS capsule Take 1 capsule (0.4 mg total) by mouth  daily.       02/22/2024    3:26 PM 09/30/2023    1:30 PM 04/04/2023    9:51 AM 09/13/2022    3:27 PM  GAD 7 : Generalized Anxiety Score  Nervous, Anxious, on Edge 0 0 0 0  Control/stop worrying 0 0 0 0  Worry too much - different things 0 0 0 0  Trouble relaxing 0 0 0 0  Restless 0 0 0 0  Easily annoyed or irritable 0 0 0 0  Afraid - awful might happen 0 0 0 0  Total GAD 7 Score 0 0 0 0  Anxiety Difficulty Not difficult at all Not difficult at all Not difficult at all Not difficult at all       02/22/2024    3:26 PM 09/30/2023    1:30 PM 04/04/2023    9:51 AM  Depression screen PHQ 2/9  Decreased Interest 0 0 0  Down, Depressed, Hopeless 0 0 0  PHQ - 2 Score 0 0 0  Altered sleeping 0 1 1  Tired, decreased energy 0 0 0  Change in appetite 0 0 0  Feeling bad or failure about yourself  0 0 0  Trouble concentrating 0 0 0  Moving slowly or fidgety/restless 0 0 0  Suicidal thoughts 0 0 0  PHQ-9 Score 0 1 1  Difficult doing work/chores Not difficult at all Not difficult at all Not difficult at all    BP Readings from Last 3 Encounters:  02/22/24 (!) 140/80  09/30/23 112/78  04/04/23 129/68    Physical Exam Vitals and nursing note reviewed.  Constitutional:      General: He is not in acute distress.    Appearance: He is well-developed.  HENT:     Head: Normocephalic and atraumatic.  Cardiovascular:     Rate and Rhythm: Normal rate and regular rhythm.  Pulmonary:     Effort: Pulmonary effort is normal. No respiratory distress.     Breath sounds: No wheezing or rhonchi.  Musculoskeletal:     Right lower leg: Tenderness present. No swelling, deformity or lacerations. No edema.     Left lower leg: No swelling, deformity or lacerations. No edema.     Comments: Right calf 17 cm Left calf 17 cm  Skin:    General: Skin is warm and dry.     Findings: No rash.  Neurological:     Mental Status: He is alert and oriented to person, place, and time.  Psychiatric:        Mood  and Affect: Mood normal.        Behavior: Behavior normal.     Wt Readings from Last 3 Encounters:  02/22/24 204 lb 4 oz (92.6 kg)  09/30/23 208 lb (94.3 kg)  04/04/23 203 lb (  92.1 kg)    BP (!) 140/80   Pulse 78   Ht 5\' 2"  (1.575 m)   Wt 204 lb 4 oz (92.6 kg)   SpO2 98%   BMI 37.36 kg/m   Assessment and Plan:  Problem List Items Addressed This Visit   None Visit Diagnoses       Right calf pain    -  Primary   rule out DVT, Bakers cyst, possible tendon disruption with US  Begin Aleve  bid if no dx - will refer to Ortho or SM   Relevant Orders   US  Venous Img Lower Unilateral Right (DVT)       No follow-ups on file.    Sheron Dixons, MD [facility] Health Primary Care and Sports Medicine [place]

## 2024-02-22 NOTE — Patient Instructions (Signed)
Take Naproxen twice a day

## 2024-02-23 ENCOUNTER — Ambulatory Visit
Admission: RE | Admit: 2024-02-23 | Discharge: 2024-02-23 | Disposition: A | Discharge status: Home / Self Care | Source: Ambulatory Visit | Attending: Internal Medicine | Admitting: Internal Medicine

## 2024-02-23 DIAGNOSIS — M79661 Pain in right lower leg: Secondary | ICD-10-CM | POA: Insufficient documentation

## 2024-04-05 ENCOUNTER — Encounter: Payer: Self-pay | Admitting: Internal Medicine

## 2024-04-27 ENCOUNTER — Other Ambulatory Visit: Payer: Self-pay | Admitting: Internal Medicine

## 2024-04-27 DIAGNOSIS — E782 Mixed hyperlipidemia: Secondary | ICD-10-CM

## 2024-05-01 NOTE — Telephone Encounter (Signed)
 Appt 05/04/24- will give courtesy RF 30 day Requested Prescriptions  Pending Prescriptions Disp Refills   atorvastatin  (LIPITOR) 10 MG tablet [Pharmacy Med Name: Atorvastatin  Calcium  10 MG Oral Tablet] 30 tablet 0    Sig: Take 1 tablet by mouth once daily     Cardiovascular:  Antilipid - Statins Failed - 05/01/2024 11:16 AM      Failed - Valid encounter within last 12 months    Recent Outpatient Visits           2 months ago Right calf pain   [place] [facility] at [facility], Leita DEL, MD       Future Appointments             In 3 days Justus Leita DEL, MD [facility] Health Primary Care & Sports Medicine at [facility], [facility]            Failed - Lipid Panel in normal range within the last 12 months    Cholesterol, Total  Date Value Ref Range Status  03/31/2022 211 (H) 100 - 199 mg/dL Final   Cholesterol  Date Value Ref Range Status  04/04/2023 175 0 - 200 mg/dL Final   LDL Chol Calc (NIH)  Date Value Ref Range Status  03/31/2022 130 (H) 0 - 99 mg/dL Final   LDL Cholesterol  Date Value Ref Range Status  04/04/2023 79 0 - 99 mg/dL Final    Comment:           Total Cholesterol/HDL:CHD Risk Coronary Heart Disease Risk Table                     Men   Women  1/2 Average Risk   3.4   3.3  Average Risk       5.0   4.4  2 X Average Risk   9.6   7.1  3 X Average Risk  23.4   11.0        Use the calculated Patient Ratio above and the CHD Risk Table to determine the patient's CHD Risk.        ATP III CLASSIFICATION (LDL):  <100     mg/dL   Optimal  899-870  mg/dL   Near or Above                    Optimal  130-159  mg/dL   Borderline  839-810  mg/dL   High  >809     mg/dL   Very High Performed at [facility], [address] [place], [place] [address]    HDL  Date Value Ref Range Status  04/04/2023 87 >40 mg/dL Final  93/92/7976 70 >60 mg/dL Final   Triglycerides  Date Value Ref Range Status  04/04/2023 44 <150  mg/dL Final         Passed - Patient is not pregnant

## 2024-05-04 ENCOUNTER — Encounter: Payer: Self-pay | Admitting: Internal Medicine

## 2024-05-04 ENCOUNTER — Ambulatory Visit (INDEPENDENT_AMBULATORY_CARE_PROVIDER_SITE_OTHER): Admitting: Internal Medicine

## 2024-05-04 VITALS — BP 122/78 | HR 77 | Ht 62.0 in | Wt 204.5 lb

## 2024-05-04 DIAGNOSIS — N401 Enlarged prostate with lower urinary tract symptoms: Secondary | ICD-10-CM | POA: Diagnosis not present

## 2024-05-04 DIAGNOSIS — N5089 Other specified disorders of the male genital organs: Secondary | ICD-10-CM | POA: Diagnosis not present

## 2024-05-04 DIAGNOSIS — E782 Mixed hyperlipidemia: Secondary | ICD-10-CM | POA: Diagnosis not present

## 2024-05-04 DIAGNOSIS — Z131 Encounter for screening for diabetes mellitus: Secondary | ICD-10-CM | POA: Diagnosis not present

## 2024-05-04 DIAGNOSIS — Z0000 Encounter for general adult medical examination without abnormal findings: Secondary | ICD-10-CM

## 2024-05-04 DIAGNOSIS — R351 Nocturia: Secondary | ICD-10-CM

## 2024-05-04 DIAGNOSIS — I10 Essential (primary) hypertension: Secondary | ICD-10-CM

## 2024-05-04 MED ORDER — ATORVASTATIN CALCIUM 10 MG PO TABS: 10.0000 mg | ORAL_TABLET | Freq: Every day | ORAL | 5 refills | Status: DC

## 2024-05-04 NOTE — Assessment & Plan Note (Signed)
 Blood pressure is well controlled.  Current medications amlodipine  and benazepril . Will continue same regimen along with efforts to limit dietary sodium.

## 2024-05-04 NOTE — Assessment & Plan Note (Signed)
 LDL is  Lab Results  Component Value Date   LDLCALC 79 04/04/2023   Current regimen is atorvastatin .  No medication side effects noted. Goal LDL is <70.

## 2024-05-04 NOTE — Progress Notes (Signed)
 Date:  05/04/2024   Name:  Earl Garcia   DOB:  1966-02-17   MRN:  [id number]   Chief Complaint: Annual Exam and Cyst (Knot on right testicle. Right side is bigger than left. Not painful. ) Earl Garcia is a 58 y.o. male who presents today for his Complete Annual Exam. He feels well. He reports exercising. He reports he is sleeping fairly well.   Health Maintenance  Topic Date Due   HIV Screening  Never done   Hepatitis B Vaccine (1 of 3 - 19+ 3-dose series) Never done   COVID-19 Vaccine (5 - 2024-25 season) 06/26/2023   Zoster (Shingles) Vaccine (1 of 2) 08/04/2024*   Flu Shot  05/25/2024   DTaP/Tdap/Td vaccine (2 - Td or Tdap) 01/06/2025   Colon Cancer Screening  09/22/2027   Hepatitis C Screening  Completed   HPV Vaccine  Aged Out   Meningitis B Vaccine  Aged Out  *Topic was postponed. The date shown is not the original due date.    Lab Results  Component Value Date   PSA1 2.3 03/31/2022   PSA1 1.6 01/20/2021   PSA1 3.3 01/29/2019   PSA 0.9 06/18/2014    Hypertension This is a chronic problem. The problem is controlled. Pertinent negatives include no chest pain, headaches, palpitations or shortness of breath. Past treatments include calcium  channel blockers and ACE inhibitors. The current treatment provides significant improvement. There is no history of kidney disease, CAD/MI or CVA.  Hyperlipidemia This is a chronic problem. Pertinent negatives include no chest pain or shortness of breath. Current antihyperlipidemic treatment includes statins. The current treatment provides significant improvement of lipids.  Benign Prostatic Hypertrophy This is a chronic problem. The problem is unchanged. He is sexually active.  Testicular mass - new over the past year, non tender, may be getting slightly larger.  Review of Systems  Constitutional:  Negative for fatigue and unexpected weight change.  HENT:  Negative for nosebleeds.   Eyes:  Negative for visual disturbance.   Respiratory:  Negative for cough, chest tightness, shortness of breath and wheezing.   Cardiovascular:  Negative for chest pain, palpitations and leg swelling.  Gastrointestinal:  Negative for abdominal pain, constipation and diarrhea.  Neurological:  Negative for dizziness, weakness, light-headedness and headaches.     Lab Results  Component Value Date   NA 138 04/04/2023   K 4.0 04/04/2023   CO2 26 04/04/2023   GLUCOSE 109 (H) 04/04/2023   BUN 14 04/04/2023   CREATININE 0.88 04/04/2023   CALCIUM  9.1 04/04/2023   EGFR 103 09/13/2022   GFRNONAA >60 04/04/2023   Lab Results  Component Value Date   CHOL 175 04/04/2023   HDL 87 04/04/2023   LDLCALC 79 04/04/2023   TRIG 44 04/04/2023   CHOLHDL 2.0 04/04/2023   Lab Results  Component Value Date   TSH 0.711 08/29/2015   Lab Results  Component Value Date   HGBA1C 5.6 08/29/2015   Lab Results  Component Value Date   WBC 4.2 04/04/2023   HGB 14.3 04/04/2023   HCT 41.4 04/04/2023   MCV 84.3 04/04/2023   PLT 235 04/04/2023   Lab Results  Component Value Date   ALT 36 04/04/2023   AST 28 04/04/2023   ALKPHOS 83 04/04/2023   BILITOT 0.5 04/04/2023   No results found for: MARIEN BOLLS, VD25OH   Patient Active Problem List   Diagnosis Date Noted   Mixed hyperlipidemia 04/01/2022   BPH associated with nocturia 12/30/2021  HNP (herniated nucleus pulposus), lumbar 01/25/2018   Pain of left hip joint 01/25/2018   Hx of adenomatous colonic polyps 01/19/2017   Essential (primary) hypertension 08/24/2015   Irritable bowel syndrome with diarrhea 08/24/2015   Migraine without aura and responsive to treatment 08/24/2015   Restless leg 08/24/2015    No Known Allergies  Past Surgical History:  Procedure Laterality Date   COLONOSCOPY  2013   tubular adenoma   COLONOSCOPY  03/09/2017   tubular adenoma   ROTATOR CUFF REPAIR     TOTAL HIP ARTHROPLASTY Left 01/20/2022    Social History   Tobacco Use    Smoking status: Never   Smokeless tobacco: Never  Substance Use Topics   Alcohol use: Yes    Alcohol/week: 4.0 standard drinks of alcohol    Types: 4 Standard drinks or equivalent per week   Drug use: No     Medication list has been reviewed and updated.  Current Meds  Medication Sig   amLODipine  (NORVASC ) 10 MG tablet Take 1 tablet (10 mg total) by mouth daily.   aspirin-acetaminophen-caffeine  (EXCEDRIN MIGRAINE) 250-250-65 MG tablet Take 2 tablets by mouth every 6 (six) hours as needed for headache.   benazepril  (LOTENSIN ) 40 MG tablet TAKE 1 TABLET BY MOUTH ONCE DAILY .   cyclobenzaprine  (FLEXERIL ) 10 MG tablet Take 1 tablet (10 mg total) by mouth 3 (three) times daily as needed for muscle spasms.   SUMAtriptan  (IMITREX ) 100 MG tablet Take 1 tablet (100 mg total) by mouth every 2 (two) hours as needed for migraine. May repeat in 2 hours if headache persists or recurs.   tamsulosin  (FLOMAX ) 0.4 MG CAPS capsule Take 1 capsule (0.4 mg total) by mouth daily.   [DISCONTINUED] atorvastatin  (LIPITOR) 10 MG tablet Take 1 tablet by mouth once daily       05/04/2024    3:04 PM 02/22/2024    3:26 PM 09/30/2023    1:30 PM 04/04/2023    9:51 AM  GAD 7 : Generalized Anxiety Score  Nervous, Anxious, on Edge 0 0 0 0  Control/stop worrying 0 0 0 0  Worry too much - different things 0 0 0 0  Trouble relaxing 0 0 0 0  Restless 0 0 0 0  Easily annoyed or irritable 0 0 0 0  Afraid - awful might happen 0 0 0 0  Total GAD 7 Score 0 0 0 0  Anxiety Difficulty Not difficult at all Not difficult at all Not difficult at all Not difficult at all       05/04/2024    3:04 PM 02/22/2024    3:26 PM 09/30/2023    1:30 PM  Depression screen PHQ 2/9  Decreased Interest 0 0 0  Down, Depressed, Hopeless 0 0 0  PHQ - 2 Score 0 0 0  Altered sleeping 0 0 1  Tired, decreased energy 0 0 0  Change in appetite 0 0 0  Feeling bad or failure about yourself  0 0 0  Trouble concentrating 0 0 0  Moving slowly or  fidgety/restless 0 0 0  Suicidal thoughts 0 0 0  PHQ-9 Score 0 0 1  Difficult doing work/chores Not difficult at all Not difficult at all Not difficult at all    BP Readings from Last 3 Encounters:  05/04/24 122/78  02/22/24 (!) 140/80  09/30/23 112/78    Physical Exam Vitals and nursing note reviewed.  Constitutional:      Appearance: Normal appearance. He is well-developed.  HENT:     Head: Normocephalic.     Right Ear: Tympanic membrane, ear canal and external ear normal.     Left Ear: Tympanic membrane, ear canal and external ear normal.     Nose: Nose normal.  Eyes:     Conjunctiva/sclera: Conjunctivae normal.     Pupils: Pupils are equal, round, and reactive to light.  Neck:     Thyroid: No thyromegaly.     Vascular: No carotid bruit.  Cardiovascular:     Rate and Rhythm: Normal rate and regular rhythm.     Heart sounds: Normal heart sounds.  Pulmonary:     Effort: Pulmonary effort is normal.     Breath sounds: Normal breath sounds. No wheezing.  Chest:  Breasts:    Right: No mass.     Left: No mass.  Abdominal:     General: Bowel sounds are normal.     Palpations: Abdomen is soft.     Tenderness: There is no abdominal tenderness.     Hernia: There is no hernia in the left inguinal area or right inguinal area.  Genitourinary:    Pubic Area: No rash.      Penis: Normal.      Testes:        Right: Mass present. Tenderness not present.        Left: Mass or tenderness not present.     Epididymis:     Right: Not inflamed. No tenderness.     Left: Not inflamed. No tenderness.    Musculoskeletal:        General: Normal range of motion.     Cervical back: Normal range of motion and neck supple.  Lymphadenopathy:     Cervical: No cervical adenopathy.  Skin:    General: Skin is warm and dry.  Neurological:     Mental Status: He is alert and oriented to person, place, and time.     Deep Tendon Reflexes: Reflexes are normal and symmetric.  Psychiatric:         Attention and Perception: Attention normal.        Mood and Affect: Mood normal.        Thought Content: Thought content normal.     Wt Readings from Last 3 Encounters:  05/04/24 204 lb 8 oz (92.8 kg)  02/22/24 204 lb 4 oz (92.6 kg)  09/30/23 208 lb (94.3 kg)    BP 122/78   Pulse 77   Ht 5' 2 (1.575 m)   Wt 204 lb 8 oz (92.8 kg)   SpO2 97%   BMI 37.40 kg/m   Assessment and Plan:  Problem List Items Addressed This Visit       Unprioritized   Essential (primary) hypertension (Chronic)   Blood pressure is well controlled.  Current medications amlodipine  and benazepril . Will continue same regimen along with efforts to limit dietary sodium.       Relevant Medications   atorvastatin  (LIPITOR) 10 MG tablet   Other Relevant Orders   CBC with Differential/Platelet   Comprehensive metabolic panel with GFR   TSH   Urinalysis, Routine w reflex microscopic   Mixed hyperlipidemia (Chronic)   LDL is  Lab Results  Component Value Date   LDLCALC 79 04/04/2023   Current regimen is atorvastatin .  No medication side effects noted. Goal LDL is <70.       Relevant Medications   atorvastatin  (LIPITOR) 10 MG tablet   Other Relevant Orders   Lipid panel  BPH associated with nocturia   Mild stable symptoms  Taking Flomax  nightly Lab Results  Component Value Date   PSA1 2.3 03/31/2022   PSA1 1.6 01/20/2021   PSA1 3.3 01/29/2019   PSA 0.9 06/18/2014          Relevant Orders   PSA   Other Visit Diagnoses       Annual physical exam    -  Primary   he will consider Shingrix vaccines CRC screening up to date continue healthy diet, exercise   Relevant Orders   CBC with Differential/Platelet   Comprehensive metabolic panel with GFR   Hemoglobin A1c   Lipid panel   PSA   TSH   Urinalysis, Routine w reflex microscopic     Screening for diabetes mellitus       Relevant Orders   Hemoglobin A1c     Testicular mass       present for the past year - non tender   possibly getting larger exam fairly benign - will get US  to evaluate   Relevant Orders   US  Scrotum       No follow-ups on file.    Leita HILARIO Adie, MD [facility] [place]

## 2024-05-04 NOTE — Assessment & Plan Note (Addendum)
 Mild stable symptoms  Taking Flomax  nightly Lab Results  Component Value Date   PSA1 2.3 03/31/2022   PSA1 1.6 01/20/2021   PSA1 3.3 01/29/2019   PSA 0.9 06/18/2014

## 2024-05-07 ENCOUNTER — Ambulatory Visit

## 2024-05-08 ENCOUNTER — Ambulatory Visit: Admission: RE | Admit: 2024-05-08 | Source: Ambulatory Visit

## 2024-10-24 ENCOUNTER — Other Ambulatory Visit: Payer: Self-pay | Admitting: Internal Medicine

## 2024-10-24 DIAGNOSIS — I10 Essential (primary) hypertension: Secondary | ICD-10-CM

## 2024-10-24 DIAGNOSIS — N401 Enlarged prostate with lower urinary tract symptoms: Secondary | ICD-10-CM

## 2024-10-25 NOTE — Telephone Encounter (Signed)
 Requested medications are due for refill today.  yes  Requested medications are on the active medications list.  yes  Last refill. 09/30/2023 #30 12 rf  Future visit scheduled.   yes  Notes to clinic.  Labs are expired.    Requested Prescriptions  Pending Prescriptions Disp Refills   tamsulosin  (FLOMAX ) 0.4 MG CAPS capsule [Pharmacy Med Name: Tamsulosin  HCl 0.4 MG Oral Capsule] 30 capsule 0    Sig: Take 1 capsule by mouth once daily     Urology: Alpha-Adrenergic Blocker Failed - 10/25/2024 11:30 AM      Failed - PSA in normal range and within 360 days    PSA  Date Value Ref Range Status  06/18/2014 0.9  Final   Prostate Specific Ag, Serum  Date Value Ref Range Status  03/31/2022 2.3 0.0 - 4.0 ng/mL Final    Comment:    Roche ECLIA methodology. According to the American Urological Association, Serum PSA should decrease and remain at undetectable levels after radical prostatectomy. The AUA defines biochemical recurrence as an initial PSA value 0.2 ng/mL or greater followed by a subsequent confirmatory PSA value 0.2 ng/mL or greater. Values obtained with different assay methods or kits cannot be used interchangeably. Results cannot be interpreted as absolute evidence of the presence or absence of malignant disease.          Passed - Last BP in normal range    BP Readings from Last 1 Encounters:  05/04/24 122/78         Passed - Valid encounter within last 12 months    Recent Outpatient Visits           5 months ago Annual physical exam   [place] [facility], Leita DEL, MD   8 months ago Right calf pain   [place] [facility] at [facility], Leita DEL, MD               benazepril  (LOTENSIN ) 40 MG tablet [Pharmacy Med Name: Benazepril  HCl 40 MG Oral Tablet] 30 tablet 0    Sig: Take 1 tablet by mouth once daily     Cardiovascular:  ACE Inhibitors Failed - 10/25/2024 11:30 AM       Failed - Cr in normal range and within 180 days    Creatinine, Ser  Date Value Ref Range Status  04/04/2023 0.88 0.61 - 1.24 mg/dL Final         Failed - K in normal range and within 180 days    Potassium  Date Value Ref Range Status  04/04/2023 4.0 3.5 - 5.1 mmol/L Final         Passed - Patient is not pregnant      Passed - Last BP in normal range    BP Readings from Last 1 Encounters:  05/04/24 122/78         Passed - Valid encounter within last 6 months    Recent Outpatient Visits           5 months ago Annual physical exam    [facility] at [facility], Leita DEL, MD   8 months ago Right calf pain    Primary Care & Sports Medicine at [facility], Leita DEL, MD              Signed Prescriptions Disp Refills   amLODipine  (NORVASC ) 10 MG tablet 90 tablet 0  Sig: Take 1 tablet by mouth once daily     Cardiovascular: Calcium  Channel Blockers 2 Passed - 10/25/2024 11:30 AM      Passed - Last BP in normal range    BP Readings from Last 1 Encounters:  05/04/24 122/78         Passed - Last Heart Rate in normal range    Pulse Readings from Last 1 Encounters:  05/04/24 77         Passed - Valid encounter within last 6 months    Recent Outpatient Visits           5 months ago Annual physical exam   [facility] at [facility], Leita DEL, MD   8 months ago Right calf pain   [place] [facility] at [facility], Leita DEL, MD

## 2024-10-25 NOTE — Telephone Encounter (Signed)
 Requested Prescriptions  Pending Prescriptions Disp Refills   tamsulosin  (FLOMAX ) 0.4 MG CAPS capsule [Pharmacy Med Name: Tamsulosin  HCl 0.4 MG Oral Capsule] 30 capsule 0    Sig: Take 1 capsule by mouth once daily     Urology: Alpha-Adrenergic Blocker Failed - 10/25/2024 11:30 AM      Failed - PSA in normal range and within 360 days    PSA  Date Value Ref Range Status  06/18/2014 0.9  Final   Prostate Specific Ag, Serum  Date Value Ref Range Status  03/31/2022 2.3 0.0 - 4.0 ng/mL Final    Comment:    Roche ECLIA methodology. According to the American Urological Association, Serum PSA should decrease and remain at undetectable levels after radical prostatectomy. The AUA defines biochemical recurrence as an initial PSA value 0.2 ng/mL or greater followed by a subsequent confirmatory PSA value 0.2 ng/mL or greater. Values obtained with different assay methods or kits cannot be used interchangeably. Results cannot be interpreted as absolute evidence of the presence or absence of malignant disease.          Passed - Last BP in normal range    BP Readings from Last 1 Encounters:  05/04/24 122/78         Passed - Valid encounter within last 12 months    Recent Outpatient Visits           5 months ago Annual physical exam   [place] [facility] at [facility], Leita DEL, MD   8 months ago Right calf pain   [place] [facility] at [facility], Leita DEL, MD               benazepril  (LOTENSIN ) 40 MG tablet [Pharmacy Med Name: Benazepril  HCl 40 MG Oral Tablet] 30 tablet 0    Sig: Take 1 tablet by mouth once daily     Cardiovascular:  ACE Inhibitors Failed - 10/25/2024 11:30 AM      Failed - Cr in normal range and within 180 days    Creatinine, Ser  Date Value Ref Range Status  04/04/2023 0.88 0.61 - 1.24 mg/dL Final         Failed - K in normal range and within 180 days    Potassium  Date Value Ref Range  Status  04/04/2023 4.0 3.5 - 5.1 mmol/L Final         Passed - Patient is not pregnant      Passed - Last BP in normal range    BP Readings from Last 1 Encounters:  05/04/24 122/78         Passed - Valid encounter within last 6 months    Recent Outpatient Visits           5 months ago Annual physical exam   [place] [facility] at [facility], Leita DEL, MD   8 months ago Right calf pain   [place] [facility] at [facility], Leita DEL, MD               amLODipine  (NORVASC ) 10 MG tablet [Pharmacy Med Name: amLODIPine  Besylate 10 MG Oral Tablet] 90 tablet 0    Sig: Take 1 tablet by mouth once daily     Cardiovascular: Calcium  Channel Blockers 2 Passed - 10/25/2024 11:30 AM      Passed - Last BP in normal range    BP  Readings from Last 1 Encounters:  05/04/24 122/78         Passed - Last Heart Rate in normal range    Pulse Readings from Last 1 Encounters:  05/04/24 77         Passed - Valid encounter within last 6 months    Recent Outpatient Visits           5 months ago Annual physical exam   [facility] at [facility], Leita DEL, MD   8 months ago Right calf pain   [facility] at [facility], Leita DEL, MD

## 2024-11-09 ENCOUNTER — Encounter: Admitting: Family Medicine

## 2024-11-22 ENCOUNTER — Other Ambulatory Visit: Payer: Self-pay | Admitting: Family Medicine

## 2024-11-22 DIAGNOSIS — N401 Enlarged prostate with lower urinary tract symptoms: Secondary | ICD-10-CM

## 2024-11-22 DIAGNOSIS — I10 Essential (primary) hypertension: Secondary | ICD-10-CM

## 2024-11-22 NOTE — Telephone Encounter (Signed)
 Requested medications are due for refill today.  yes  Requested medications are on the active medications list.  yes  Last refill. 10/26/2024 #30 0 rf for both  Future visit scheduled.   no  Notes to clinic.  Labs are expired.    Requested Prescriptions  Pending Prescriptions Disp Refills   tamsulosin  (FLOMAX ) 0.4 MG CAPS capsule [Pharmacy Med Name: Tamsulosin  HCl 0.4 MG Oral Capsule] 30 capsule 0    Sig: Take 1 capsule by mouth once daily     Urology: Alpha-Adrenergic Blocker Failed - 11/22/2024  4:29 PM      Failed - PSA in normal range and within 360 days    PSA  Date Value Ref Range Status  06/18/2014 0.9  Final   Prostate Specific Ag, Serum  Date Value Ref Range Status  03/31/2022 2.3 0.0 - 4.0 ng/mL Final    Comment:    Roche ECLIA methodology. According to the American Urological Association, Serum PSA should decrease and remain at undetectable levels after radical prostatectomy. The AUA defines biochemical recurrence as an initial PSA value 0.2 ng/mL or greater followed by a subsequent confirmatory PSA value 0.2 ng/mL or greater. Values obtained with different assay methods or kits cannot be used interchangeably. Results cannot be interpreted as absolute evidence of the presence or absence of malignant disease.          Passed - Last BP in normal range    BP Readings from Last 1 Encounters:  05/04/24 122/78         Passed - Valid encounter within last 12 months    Recent Outpatient Visits           6 months ago Annual physical exam   [place] [facility] at [facility], Leita DEL, MD   9 months ago Right calf pain   [place] [facility] at [facility], Leita DEL, MD               benazepril  (LOTENSIN ) 40 MG tablet [Pharmacy Med Name: Benazepril  HCl 40 MG Oral Tablet] 30 tablet 0    Sig: Take 1 tablet by mouth once daily     Cardiovascular:  ACE Inhibitors Failed - 11/22/2024  4:29 PM       Failed - Cr in normal range and within 180 days    Creatinine, Ser  Date Value Ref Range Status  04/04/2023 0.88 0.61 - 1.24 mg/dL Final         Failed - K in normal range and within 180 days    Potassium  Date Value Ref Range Status  04/04/2023 4.0 3.5 - 5.1 mmol/L Final         Failed - Valid encounter within last 6 months    Recent Outpatient Visits           6 months ago Annual physical exam   [place] [facility] at [facility], Leita DEL, MD   9 months ago Right calf pain    Primary Care & Sports Medicine at [facility], Leita DEL, MD              Passed - Patient is not pregnant      Passed - Last BP in normal range    BP Readings from Last 1 Encounters:  05/04/24 122/78

## 2024-11-22 NOTE — Telephone Encounter (Signed)
 Please review medication refill request

## 2024-11-23 NOTE — Telephone Encounter (Signed)
 Please see my above text.

## 2024-11-23 NOTE — Telephone Encounter (Signed)
 Scheduled for February 26.

## 2024-11-23 NOTE — Telephone Encounter (Signed)
 Please schedule a TOC visit.

## 2024-11-28 ENCOUNTER — Other Ambulatory Visit: Payer: Self-pay | Admitting: Family Medicine

## 2024-11-28 DIAGNOSIS — E782 Mixed hyperlipidemia: Secondary | ICD-10-CM

## 2024-11-28 NOTE — Telephone Encounter (Unsigned)
 Copied from CRM #[id number]. Topic: Clinical - Medication Question >> Nov 28, 2024 12:46 PM Myrick T wrote: Reason for CRM: patient called to f/u on script for atorvastatin  (LIPITOR) 10 MG tablet. Pharmacy said they have not recvd the script. Please f/u with patient

## 2024-11-29 MED ORDER — ATORVASTATIN CALCIUM 10 MG PO TABS: 10.0000 mg | ORAL_TABLET | Freq: Every day | ORAL | 0 refills | Status: AC

## 2024-11-29 NOTE — Telephone Encounter (Signed)
 Requested medication (s) are due for refill today: Yes  Requested medication (s) are on the active medication list: Yes  Last refill:  05/04/24  Future visit scheduled: Yes  Notes to clinic:  Unable to refill per protocol due to failed labs, no updated results.      Requested Prescriptions  Pending Prescriptions Disp Refills   atorvastatin  (LIPITOR) 10 MG tablet 30 tablet 5    Sig: Take 1 tablet (10 mg total) by mouth daily.     Cardiovascular:  Antilipid - Statins Failed - 11/29/2024 12:26 PM      Failed - Lipid Panel in normal range within the last 12 months    Cholesterol, Total  Date Value Ref Range Status  03/31/2022 211 (H) 100 - 199 mg/dL Final   Cholesterol  Date Value Ref Range Status  04/04/2023 175 0 - 200 mg/dL Final   LDL Chol Calc (NIH)  Date Value Ref Range Status  03/31/2022 130 (H) 0 - 99 mg/dL Final   LDL Cholesterol  Date Value Ref Range Status  04/04/2023 79 0 - 99 mg/dL Final    Comment:           Total Cholesterol/HDL:CHD Risk Coronary Heart Disease Risk Table                     Men   Women  1/2 Average Risk   3.4   3.3  Average Risk       5.0   4.4  2 X Average Risk   9.6   7.1  3 X Average Risk  23.4   11.0        Use the calculated Patient Ratio above and the CHD Risk Table to determine the patient's CHD Risk.        ATP III CLASSIFICATION (LDL):  <100     mg/dL   Optimal  899-870  mg/dL   Near or Above                    Optimal  130-159  mg/dL   Borderline  839-810  mg/dL   High  >809     mg/dL   Very High Performed at [facility], [address] [place], [place] [address]    HDL  Date Value Ref Range Status  04/04/2023 87 >40 mg/dL Final  93/92/7976 70 >60 mg/dL Final   Triglycerides  Date Value Ref Range Status  04/04/2023 44 <150 mg/dL Final         Passed - Patient is not pregnant      Passed - Valid encounter within last 12 months    Recent Outpatient Visits           6 months ago Annual physical  exam   [place] [facility] at [facility], Leita DEL, MD   9 months ago Right calf pain   [place] [facility] at [facility], Leita DEL, MD

## 2024-11-29 NOTE — Telephone Encounter (Signed)
 Copied from CRM [id number]. Topic: Clinical - Medication Question >> Nov 29, 2024 12:17 PM Gustabo D wrote:  atorvastatin  (LIPITOR) 10 MG tablet- told pt to fu with pharmacy

## 2024-12-19 ENCOUNTER — Encounter: Admitting: Family Medicine

## 2024-12-20 ENCOUNTER — Encounter: Admitting: Family Medicine
